# Patient Record
Sex: Female | Born: 1985 | Hispanic: No | Marital: Married | Smoking: Current every day smoker
Health system: Southern US, Community
[De-identification: ages and names within clinical notes are randomized; demographics above are authoritative.]

## PROBLEM LIST (undated history)

## (undated) ENCOUNTER — Inpatient Hospital Stay (HOSPITAL_COMMUNITY): Payer: Self-pay

## (undated) DIAGNOSIS — F1111 Opioid abuse, in remission: Secondary | ICD-10-CM

## (undated) DIAGNOSIS — R87629 Unspecified abnormal cytological findings in specimens from vagina: Secondary | ICD-10-CM

## (undated) DIAGNOSIS — O139 Gestational [pregnancy-induced] hypertension without significant proteinuria, unspecified trimester: Secondary | ICD-10-CM

## (undated) HISTORY — DX: Opioid abuse, in remission: F11.11

## (undated) HISTORY — DX: Gestational (pregnancy-induced) hypertension without significant proteinuria, unspecified trimester: O13.9

## (undated) HISTORY — PX: CHOLECYSTECTOMY: SHX55

## (undated) HISTORY — DX: Unspecified abnormal cytological findings in specimens from vagina: R87.629

---

## 2003-01-20 DIAGNOSIS — R87629 Unspecified abnormal cytological findings in specimens from vagina: Secondary | ICD-10-CM

## 2003-01-20 HISTORY — DX: Unspecified abnormal cytological findings in specimens from vagina: R87.629

## 2004-01-20 HISTORY — PX: COLPOSCOPY: SHX161

## 2005-01-19 DIAGNOSIS — O139 Gestational [pregnancy-induced] hypertension without significant proteinuria, unspecified trimester: Secondary | ICD-10-CM

## 2005-01-19 HISTORY — DX: Gestational (pregnancy-induced) hypertension without significant proteinuria, unspecified trimester: O13.9

## 2005-05-10 DIAGNOSIS — O139 Gestational [pregnancy-induced] hypertension without significant proteinuria, unspecified trimester: Secondary | ICD-10-CM

## 2012-10-29 ENCOUNTER — Encounter (HOSPITAL_COMMUNITY): Payer: Self-pay | Admitting: *Deleted

## 2012-10-29 ENCOUNTER — Inpatient Hospital Stay (HOSPITAL_COMMUNITY)
Admission: AD | Admit: 2012-10-29 | Discharge: 2012-10-29 | Disposition: A | Discharge status: Home / Self Care | Payer: Self-pay | Source: Ambulatory Visit | Attending: Obstetrics & Gynecology | Admitting: Obstetrics & Gynecology

## 2012-10-29 DIAGNOSIS — O26859 Spotting complicating pregnancy, unspecified trimester: Secondary | ICD-10-CM | POA: Insufficient documentation

## 2012-10-29 DIAGNOSIS — O0930 Supervision of pregnancy with insufficient antenatal care, unspecified trimester: Secondary | ICD-10-CM | POA: Insufficient documentation

## 2012-10-29 DIAGNOSIS — R109 Unspecified abdominal pain: Secondary | ICD-10-CM | POA: Insufficient documentation

## 2012-10-29 DIAGNOSIS — O26899 Other specified pregnancy related conditions, unspecified trimester: Secondary | ICD-10-CM

## 2012-10-29 DIAGNOSIS — O9989 Other specified diseases and conditions complicating pregnancy, childbirth and the puerperium: Secondary | ICD-10-CM

## 2012-10-29 LAB — URINALYSIS, ROUTINE W REFLEX MICROSCOPIC
Glucose, UA: NEGATIVE mg/dL
Leukocytes, UA: NEGATIVE
Protein, ur: NEGATIVE mg/dL
Specific Gravity, Urine: 1.02 (ref 1.005–1.030)
Urobilinogen, UA: 0.2 mg/dL (ref 0.0–1.0)

## 2012-10-29 LAB — WET PREP, GENITAL
Trich, Wet Prep: NONE SEEN
Yeast Wet Prep HPF POC: NONE SEEN

## 2012-10-29 LAB — URINE MICROSCOPIC-ADD ON

## 2012-10-29 MED ORDER — PRENATAL PLUS 27-1 MG PO TABS: 1.0000 | ORAL_TABLET | Freq: Every day | ORAL | Status: DC

## 2012-10-29 NOTE — MAU Note (Signed)
Pt presents with complaints of abdominal cramping and vaginal spotting for approximately 3 days.

## 2012-10-29 NOTE — MAU Provider Note (Signed)
Attestation of Attending Supervision of Advanced Practitioner (PA/CNM/NP): Evaluation and management procedures were performed by the Advanced Practitioner under my supervision and collaboration.  I have reviewed the Advanced Practitioner's note and chart, and I agree with the management and plan.  UGONNA  ANYANWU, MD, FACOG Attending Obstetrician & Gynecologist Faculty Practice, [facility]   

## 2012-10-29 NOTE — MAU Provider Note (Signed)
History     CSN: [id number]  Arrival date and time: 10/29/12 1021   None     Chief Complaint  Patient presents with  . Abdominal Cramping  . Vaginal Bleeding   HPI Mikayla Williamson is a 27 y.o. [phone] female @ [redacted]w[redacted]d by uncertain LMP who presents w/ report of cramping and spotting x 3 days. Increased urinary frequency x last few weeks, dysuria 'sometimes'.  Denies abnormal or malodorous d/c, has had some vulvar irritation recently, denies itching. Last sexual intercourse ~42month ago.  Reports fm x ~3.5wks. Denies lof. GHTN//Pre-e w/ 1st pregnancy, no complications w/ 2nd pregnancy. Both term SVDs. Lives in [place], no pnc to date, medicaid is pending. Went to [facility] x 1 for verification of pregnancy, then [facility] hospital x 1. Didn't want to go [facility] today.  Thinks she wants to start pnc around here. States no one has done an u/s, 'they just don't care how far along I am'. Not taking pnv. RN states 1st thing she was asked when entering room, was if they could get an u/s.   OB History   Grav Para Term Preterm Abortions TAB SAB Ect Mult Living   4 2 2  0 1 0 1 0 0 2      No past medical history on file.  No past surgical history on file.  No family history on file.  History  Substance Use Topics  . Smoking status: Not on file  . Smokeless tobacco: Not on file  . Alcohol Use: Not on file    Allergies: Allergies not on file  No prescriptions prior to admission    Review of Systems  Constitutional: Negative.   HENT: Negative.   Eyes: Negative.   Respiratory: Negative.   Cardiovascular: Negative.   Gastrointestinal: Positive for abdominal pain (cramping).  Genitourinary: Positive for dysuria ('sometimes') and frequency (x last few weeks).  Musculoskeletal: Negative.   Skin: Negative.   Neurological: Negative.   Endo/Heme/Allergies: Negative.   Psychiatric/Behavioral: Negative.    Physical Exam   Blood pressure 130/72, pulse 104, temperature 97.4 F  (36.3 C), temperature source Oral, resp. rate 18, height 5\' 2"  (1.575 m), weight 77.565 kg (171 lb), last menstrual period 04/28/2012.  Physical Exam  Constitutional: She is oriented to person, place, and time. She appears well-developed and well-nourished.  HENT:  Head: Normocephalic.  Neck: Normal range of motion.  Cardiovascular: Normal rate.   Respiratory: Effort normal.  GI: Soft. There is no tenderness.  FH 24cm  Genitourinary:  Spec exam: cx visually closed, mod amt creamy white nonodorous d/c. No active or residual bleeding.  No erythema or visible areas of irritation to external genitalia SVE: LTC high  Musculoskeletal: Normal range of motion.  Neurological: She is alert and oriented to person, place, and time.  Skin: Skin is warm and dry.  Psychiatric: She has a normal mood and affect. Her behavior is normal. Judgment and thought content normal.   FHR: 145 via doppler  MAU Course  Procedures  FHR via doppler by RN upon arrival UA Spec exam w/ gc/ch and wet prep SVE  Offered to send message to our clinics to begin pnc, pt would like for me to do that  Results for orders placed during the hospital encounter of 10/29/12 (from the past 24 hour(s))  URINALYSIS, ROUTINE W REFLEX MICROSCOPIC     Status: Abnormal   Collection Time    10/29/12 10:33 AM      Result Value Range  Color, Urine YELLOW  YELLOW   APPearance CLEAR  CLEAR   Specific Gravity, Urine 1.020  1.005 - 1.030   pH 7.0  5.0 - 8.0   Glucose, UA NEGATIVE  NEGATIVE mg/dL   Hgb urine dipstick TRACE (*) NEGATIVE   Bilirubin Urine NEGATIVE  NEGATIVE   Ketones, ur NEGATIVE  NEGATIVE mg/dL   Protein, ur NEGATIVE  NEGATIVE mg/dL   Urobilinogen, UA 0.2  0.0 - 1.0 mg/dL   Nitrite NEGATIVE  NEGATIVE   Leukocytes, UA NEGATIVE  NEGATIVE  URINE MICROSCOPIC-ADD ON     Status: Abnormal   Collection Time    10/29/12 10:33 AM      Result Value Range   Squamous Epithelial / LPF FEW (*) RARE   WBC, UA 0-2  <3  WBC/hpf   RBC / HPF 3-6  <3 RBC/hpf   Bacteria, UA MANY (*) RARE  WET PREP, GENITAL     Status: Abnormal   Collection Time    10/29/12 11:30 AM      Result Value Range   Yeast Wet Prep HPF POC NONE SEEN  NONE SEEN   Trich, Wet Prep NONE SEEN  NONE SEEN   Clue Cells Wet Prep HPF POC NONE SEEN  NONE SEEN   WBC, Wet Prep HPF POC MANY (*) NONE SEEN     Assessment and Plan  A:   [redacted]w[redacted]d GA  [id number]   Cramping, reported spotting, w/ normal exam today  No PNC to date   P:   D/C home  Rx prenatal plus #30 w/ 12RF  Push po fluids, rest, pelvic rest   OP dating/anatomy u/s ordered   Message sent to [facility] that pt needed new ob appt  Pt to call clinic if she doesn't here from them on Mon  Reviewed ptl s/s, fm, reasons to return    Marge Duncans 10/29/2012, 11:14 AM

## 2012-10-30 LAB — CULTURE, OB URINE
Colony Count: 10000
Special Requests: NORMAL

## 2012-11-04 ENCOUNTER — Ambulatory Visit (HOSPITAL_COMMUNITY): Admission: RE | Admit: 2012-11-04 | Payer: Self-pay | Source: Ambulatory Visit

## 2012-11-04 ENCOUNTER — Ambulatory Visit (HOSPITAL_COMMUNITY)
Admission: RE | Admit: 2012-11-04 | Discharge: 2012-11-04 | Disposition: A | Discharge status: Home / Self Care | Payer: Self-pay | Source: Ambulatory Visit | Attending: Women's Health | Admitting: Women's Health

## 2012-11-04 DIAGNOSIS — O0930 Supervision of pregnancy with insufficient antenatal care, unspecified trimester: Secondary | ICD-10-CM | POA: Insufficient documentation

## 2012-11-04 DIAGNOSIS — O26899 Other specified pregnancy related conditions, unspecified trimester: Secondary | ICD-10-CM

## 2012-11-04 DIAGNOSIS — Z3689 Encounter for other specified antenatal screening: Secondary | ICD-10-CM | POA: Insufficient documentation

## 2012-11-04 IMAGING — US US OB COMP +14 WK
1 series · 12 of 28 positions shown · non-contrast
Comparison: none

[Series 1: us ob comp +14 wk · 84 acquisitions, 12 frames shown]
[im 4/84]
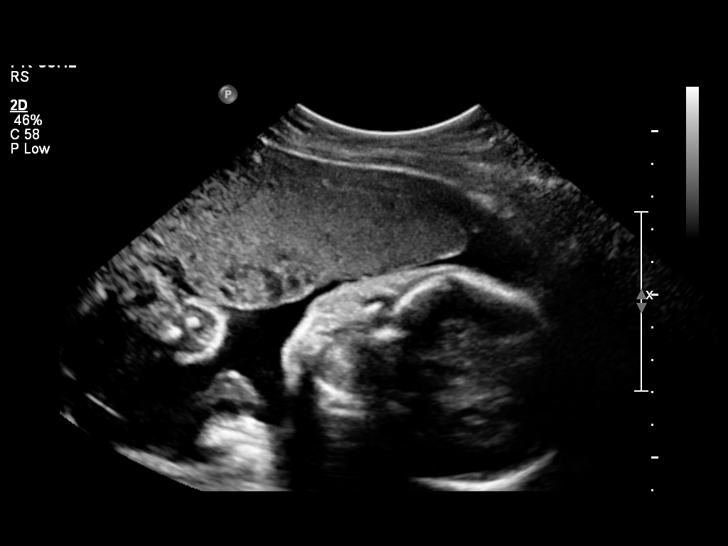
[im 10/84]
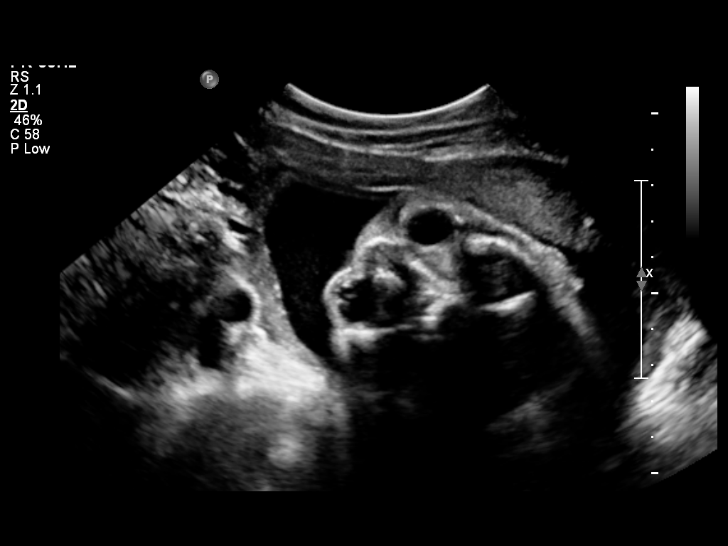
[im 16/84]
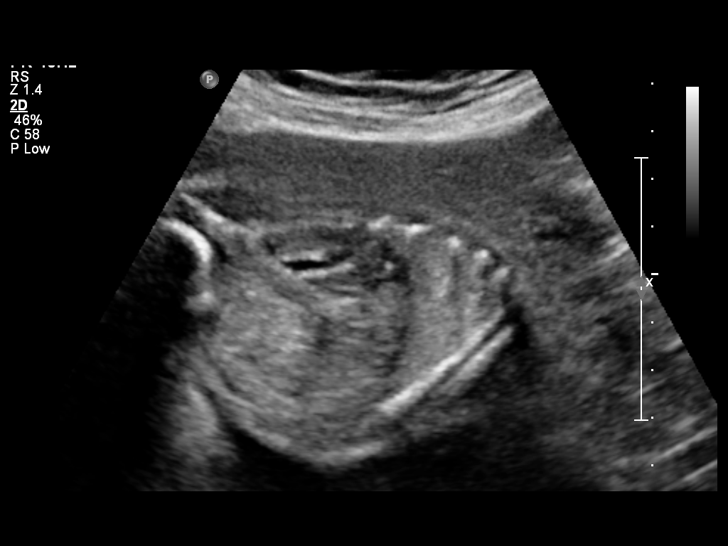
[im 25/84]
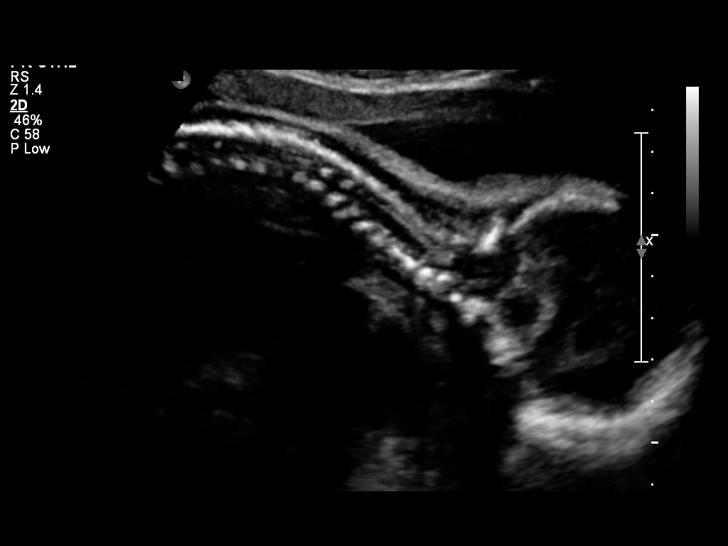
[im 31/84]
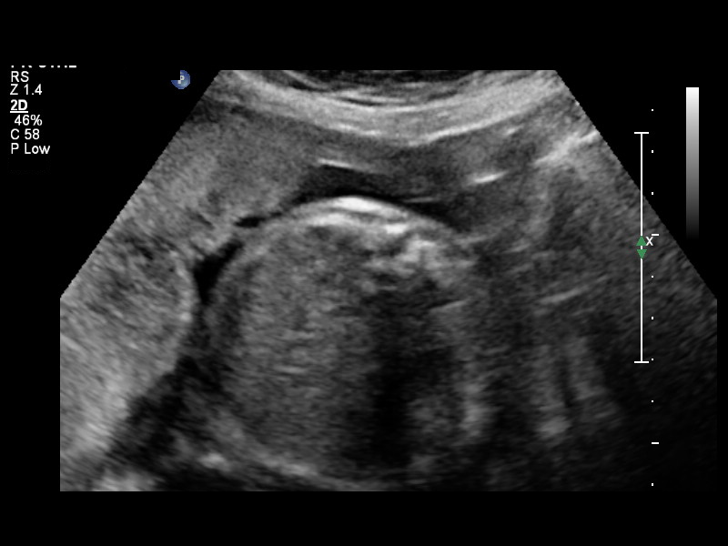
[im 37/84]
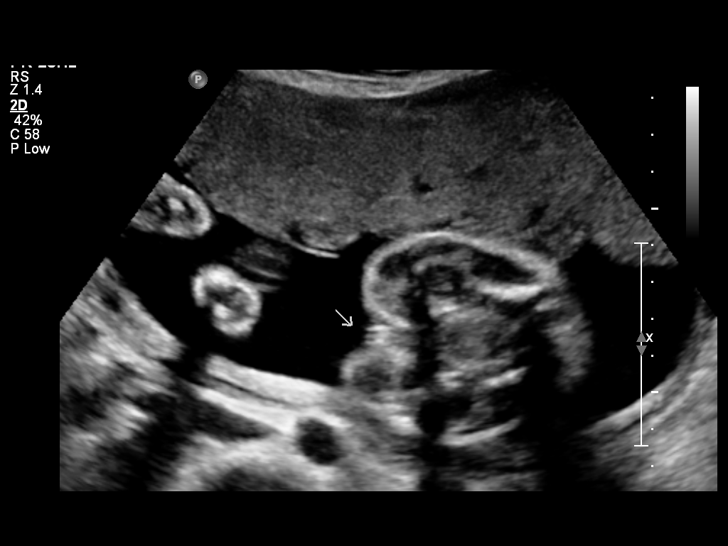
[im 47/84]
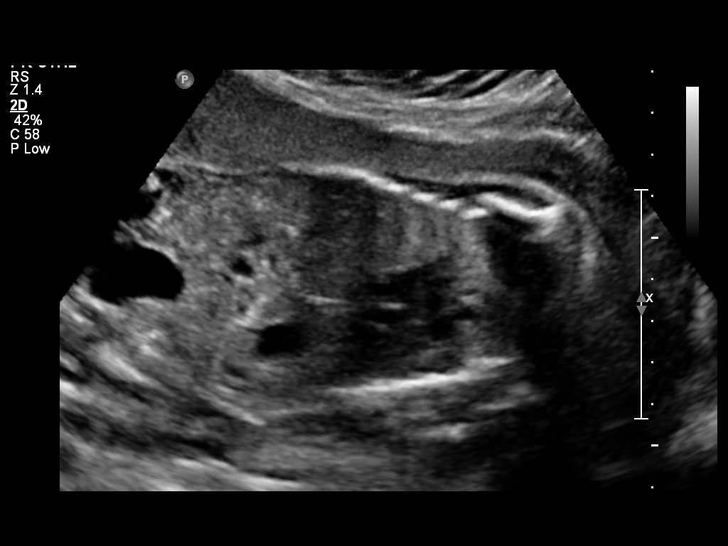
[im 53/84]
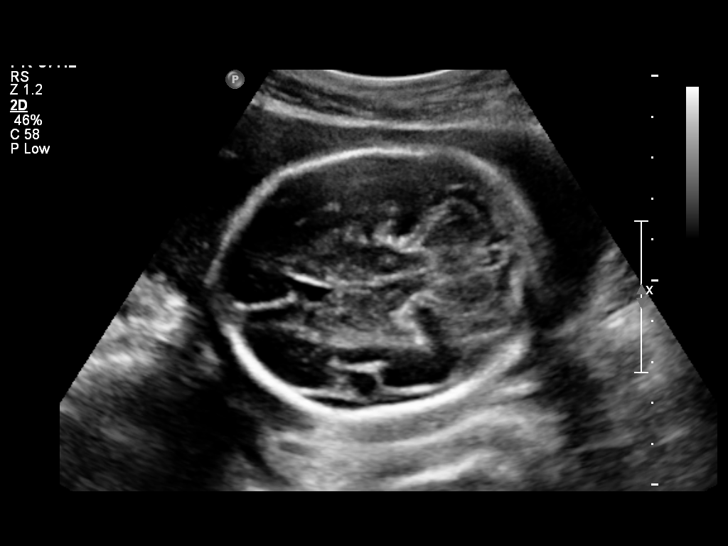
[im 59/84]
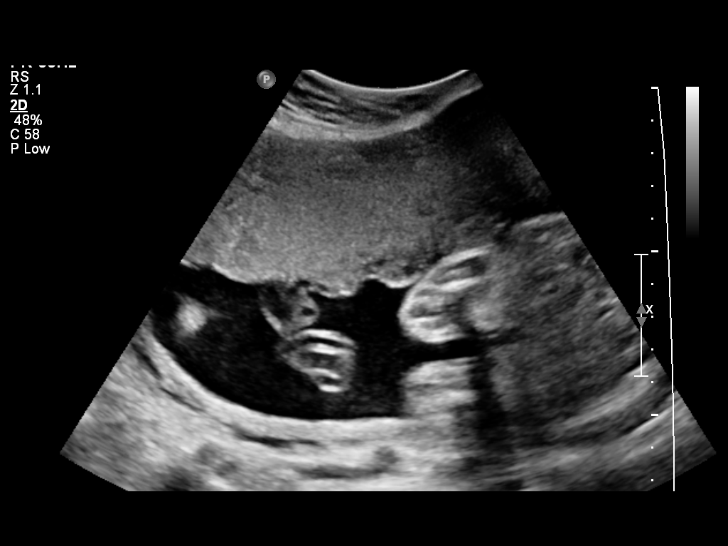
[im 68/84]
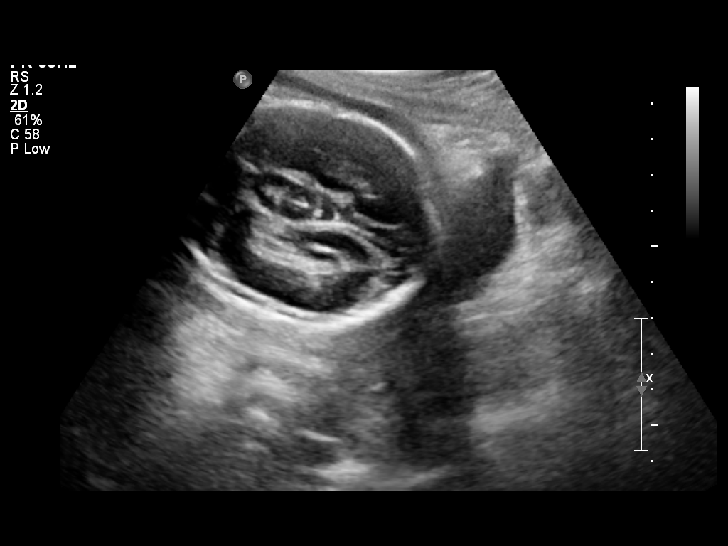
[im 74/84]
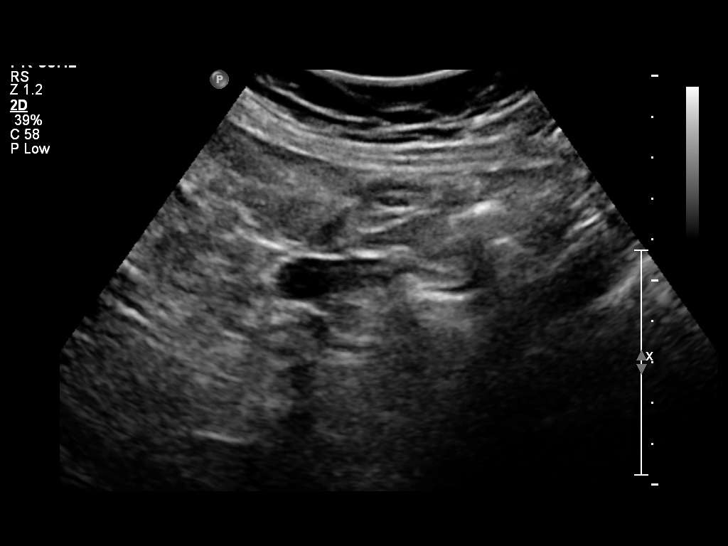
[im 80/84]
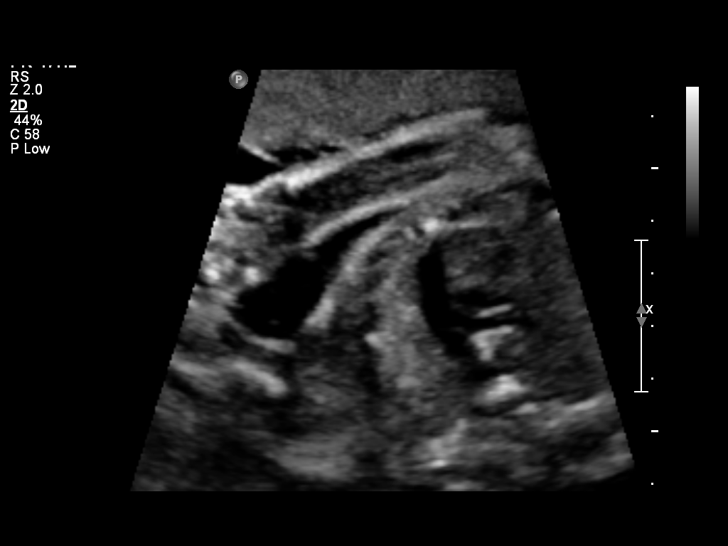

[12 of 28 positions shown; findings below may reference images not displayed]

OBSTETRICS REPORT
                      (Signed Final [DATE] [DATE])

Service(s) Provided

 US OB COMP + 14 WK                                    76805.1
Indications

 Basic anatomic survey                                 [QR]
 No or Little Prenatal Care                            [QR]
Fetal Evaluation

 Num Of Fetuses:    1
 Fetal Heart Rate:  139                          bpm
 Cardiac Activity:  Observed
 Presentation:      Cephalic
 Placenta:          Anterior, above cervical os
 P. Cord            Not well visualized
 Insertion:

 Amniotic Fluid
 AFI FV:      Subjectively within normal limits
                                             Larg Pckt:     4.6  cm
Biometry

 BPD:       63  mm     G. Age:  25w 4d                CI:        71.21   70 - 86
                                                      FL/HC:      20.4   18.6 -

 HC:     237.8  mm     G. Age:  25w 6d       18  %    HC/AC:      1.06   1.04 -

 AC:     223.7  mm     G. Age:  26w 5d       61  %    FL/BPD:     77.1   71 - 87
 FL:      48.6  mm     G. Age:  26w 2d       42  %    FL/AC:      21.7   20 - 24

 Est. FW:     938  gm      2 lb 1 oz     60  %
Gestational Age

 U/S Today:     26w 1d                                        EDD:   [DATE]
 Best:          26w 1d     Det. By:  U/S ([DATE])           EDD:   [DATE]
Anatomy

 Cranium:          Appears normal         Aortic Arch:      Appears normal
 Fetal Cavum:      Appears normal         Ductal Arch:      Basic anatomy
                                                            exam per order
 Ventricles:       Appears normal         Diaphragm:        Appears normal
 Choroid Plexus:   Appears normal         Stomach:          Appears normal, left
                                                            sided
 Cerebellum:       Appears normal         Abdomen:          Appears normal
 Posterior Fossa:  Appears normal         Abdominal Wall:   Appears nml (cord
                                                            insert, abd wall)
 Nuchal Fold:      Not applicable (>20    Cord Vessels:     Appears normal (3
                   wks GA)                                  vessel cord)
 Face:             Appears normal         Kidneys:          Appear normal
                   (orbits and profile)
 Lips:             Not well visualized    Bladder:          Appears normal
 Palate:           Not well visualized    Spine:            Appears normal
 Heart:            Appears normal         Lower             Appears normal
                   (4CH, axis, and        Extremities:
                   situs)
 RVOT:             Appears normal         Upper             Appears normal
                                          Extremities:
 LVOT:             Appears normal

 Other:  Female gender. Nasal bone visualized. Heels visualized.
Cervix Uterus Adnexa

 Cervical Length:    3.9      cm

 Cervix:       Normal appearance by transabdominal scan.
 Left Ovary:    Within normal limits.
 Right Ovary:   Not visualized.

 Adnexa:     No abnormality visualized.
Impression

 Active SIUP at [QR] on comprehensive fetal survey
 EFW is at the 60th percentile for GA
 AFV is gestational age appropriate
 No dysmorphic features with limitations as documented
 above
Recommendations

 Follow up interval growth in 4-6 weeks with attempt to
 complete fetal survey.

 questions or concerns.

## 2012-11-21 ENCOUNTER — Encounter: Payer: Self-pay | Admitting: Family Medicine

## 2013-09-03 ENCOUNTER — Encounter (HOSPITAL_COMMUNITY): Payer: Self-pay | Admitting: *Deleted

## 2013-11-20 ENCOUNTER — Encounter (HOSPITAL_COMMUNITY): Payer: Self-pay | Admitting: *Deleted

## 2015-12-18 ENCOUNTER — Encounter (HOSPITAL_COMMUNITY): Payer: Self-pay

## 2015-12-18 ENCOUNTER — Encounter (HOSPITAL_COMMUNITY): Payer: Self-pay | Admitting: *Deleted

## 2015-12-18 ENCOUNTER — Inpatient Hospital Stay (HOSPITAL_COMMUNITY)
Admission: AD | Admit: 2015-12-18 | Discharge: 2015-12-18 | Disposition: A | Discharge status: Home / Self Care | Payer: Medicaid Other | Source: Ambulatory Visit | Attending: Obstetrics & Gynecology | Admitting: Obstetrics & Gynecology

## 2015-12-18 DIAGNOSIS — Z3A27 27 weeks gestation of pregnancy: Secondary | ICD-10-CM | POA: Diagnosis not present

## 2015-12-18 DIAGNOSIS — R220 Localized swelling, mass and lump, head: Secondary | ICD-10-CM | POA: Diagnosis present

## 2015-12-18 DIAGNOSIS — K0889 Other specified disorders of teeth and supporting structures: Secondary | ICD-10-CM | POA: Diagnosis present

## 2015-12-18 DIAGNOSIS — O99332 Smoking (tobacco) complicating pregnancy, second trimester: Secondary | ICD-10-CM | POA: Insufficient documentation

## 2015-12-18 DIAGNOSIS — K047 Periapical abscess without sinus: Secondary | ICD-10-CM | POA: Insufficient documentation

## 2015-12-18 DIAGNOSIS — O98812 Other maternal infectious and parasitic diseases complicating pregnancy, second trimester: Secondary | ICD-10-CM | POA: Insufficient documentation

## 2015-12-18 DIAGNOSIS — O0932 Supervision of pregnancy with insufficient antenatal care, second trimester: Secondary | ICD-10-CM

## 2015-12-18 DIAGNOSIS — R030 Elevated blood-pressure reading, without diagnosis of hypertension: Secondary | ICD-10-CM

## 2015-12-18 LAB — COMPREHENSIVE METABOLIC PANEL
ALBUMIN: 3 g/dL — AB (ref 3.5–5.0)
ALK PHOS: 122 U/L (ref 38–126)
ALT: 27 U/L (ref 14–54)
AST: 25 U/L (ref 15–41)
Anion gap: 9 (ref 5–15)
BILIRUBIN TOTAL: 0.7 mg/dL (ref 0.3–1.2)
BUN: 8 mg/dL (ref 6–20)
CALCIUM: 8.8 mg/dL — AB (ref 8.9–10.3)
CO2: 25 mmol/L (ref 22–32)
CREATININE: 0.46 mg/dL (ref 0.44–1.00)
Chloride: 102 mmol/L (ref 101–111)
GFR calc Af Amer: >60 mL/min (ref >60)
GLUCOSE: 84 mg/dL (ref 65–99)
Potassium: 3.7 mmol/L (ref 3.5–5.1)
Sodium: 136 mmol/L (ref 135–145)
TOTAL PROTEIN: 7.8 g/dL (ref 6.5–8.1)

## 2015-12-18 LAB — CBC
HEMATOCRIT: 38.8 % (ref 36.0–46.0)
HEMOGLOBIN: 13.7 g/dL (ref 12.0–15.0)
MCH: 31.2 pg (ref 26.0–34.0)
MCHC: 35.3 g/dL (ref 30.0–36.0)
MCV: 88.4 fL (ref 78.0–100.0)
Platelets: 267 10*3/uL (ref 150–400)
RBC: 4.39 MIL/uL (ref 3.87–5.11)
RDW: 13.7 % (ref 11.5–15.5)
WBC: 14.2 10*3/uL — ABNORMAL HIGH (ref 4.0–10.5)

## 2015-12-18 LAB — DIFFERENTIAL
BASOS ABS: 0 10*3/uL (ref 0.0–0.1)
BASOS PCT: 0 %
EOS ABS: 0.1 10*3/uL (ref 0.0–0.7)
EOS PCT: 1 %
LYMPHS ABS: 1.8 10*3/uL (ref 0.7–4.0)
Lymphocytes Relative: 13 %
MONOS PCT: 11 %
Monocytes Absolute: 1.5 10*3/uL — ABNORMAL HIGH (ref 0.1–1.0)
NEUTROS PCT: 75 %
Neutro Abs: 10.7 10*3/uL — ABNORMAL HIGH (ref 1.7–7.7)

## 2015-12-18 LAB — RAPID URINE DRUG SCREEN, HOSP PERFORMED
Amphetamines: NOT DETECTED
Barbiturates: NOT DETECTED
Benzodiazepines: NOT DETECTED
COCAINE: NOT DETECTED
OPIATES: POSITIVE — AB
TETRAHYDROCANNABINOL: NOT DETECTED

## 2015-12-18 LAB — URINALYSIS, ROUTINE W REFLEX MICROSCOPIC
Glucose, UA: NEGATIVE mg/dL
KETONES UR: 15 mg/dL — AB
Leukocytes, UA: NEGATIVE
NITRITE: POSITIVE — AB
PROTEIN: 100 mg/dL — AB
pH: 6 (ref 5.0–8.0)

## 2015-12-18 LAB — TYPE AND SCREEN
ABO/RH(D): O POS
Antibody Screen: NEGATIVE

## 2015-12-18 LAB — URINE MICROSCOPIC-ADD ON

## 2015-12-18 LAB — PROTEIN / CREATININE RATIO, URINE
CREATININE, URINE: 349 mg/dL
Protein Creatinine Ratio: 0.21 mg/mg{Cre} — ABNORMAL HIGH (ref 0.00–0.15)
Total Protein, Urine: 73 mg/dL

## 2015-12-18 LAB — HEPATITIS B SURFACE ANTIGEN: Hepatitis B Surface Ag: NEGATIVE

## 2015-12-18 MED ORDER — CEPHALEXIN 500 MG PO CAPS: 500.0000 mg | ORAL_CAPSULE | Freq: Four times a day (QID) | ORAL | 0 refills | Status: DC

## 2015-12-18 MED ORDER — OXYCODONE-ACETAMINOPHEN 5-325 MG PO TABS
2.0000 | ORAL_TABLET | Freq: Once | ORAL | Status: AC
  Administered 2015-12-18: 2 via ORAL
  Filled 2015-12-18: qty 2

## 2015-12-18 MED ORDER — IBUPROFEN 600 MG PO TABS: 600.0000 mg | ORAL_TABLET | Freq: Four times a day (QID) | ORAL | 0 refills | Status: DC | PRN

## 2015-12-18 MED ORDER — OXYCODONE-ACETAMINOPHEN 5-325 MG PO TABS: 1.0000 | ORAL_TABLET | ORAL | 0 refills | Status: DC | PRN

## 2015-12-18 NOTE — MAU Note (Signed)
The left side of her face was swollen when she woke up.  Has been having a tooth ache,(upper left). Lower left side of face is numb

## 2015-12-18 NOTE — MAU Provider Note (Signed)
History     CSN: [id number]  Arrival date and time: 12/18/15 1614   None     Chief Complaint  Patient presents with  . Facial Swelling  . Dental Pain   HPI   Ms. Mikayla Williamson is a 29 y.o. female G5P3013 @ [redacted]w[redacted]d here in MAU with left sided dental pain and facial swelling. The pain started 3 days and the swelling started this morning. She took an ibuprofen today and 1 percocet that a friend gave her. This only helped her pain minimally.  She denies SOB, or trouble breathing.  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   + fetal movement History of Gestational HTN with first pregnancy only- negative for history of preeclampsia.   OB History    Gravida Para Term Preterm AB Living   5 3 3 0 1 3   SAB TAB Ectopic Multiple Live Births   1 0 0   2      History reviewed. No pertinent past medical history.  Past Surgical History:  Procedure Laterality Date  . CHOLECYSTECTOMY      History reviewed. No pertinent family history.  Social History  Substance Use Topics  . Smoking status: Current Every Day Smoker  . Smokeless tobacco: Current User  . Alcohol use No    Allergies: No Known Allergies  Prescriptions Prior to Admission  Medication Sig Dispense Refill Last Dose  . buprenorphine (SUBUTEX) 8 MG SUBL SL tablet Place 8 mg under the tongue 2 (two) times daily.   12/17/2015 at Unknown time  . ibuprofen (ADVIL,MOTRIN) 200 MG tablet Take 600 mg by mouth every 6 (six) hours as needed for mild pain.   12/18/2015 at Unknown time  . prenatal vitamin w/FE, FA (PRENATAL 1 + 1) 27-1 MG TABS tablet Take 1 tablet by mouth daily at 12 noon. (Patient not taking: Reported on 12/18/2015) 30 each 12 Not Taking at Unknown time   Results for orders placed or performed during the hospital encounter of 12/18/15 (from the past 48 hour(s))  Urinalysis, Routine w reflex microscopic (not at [facility])     Status: Abnormal   Collection Time: 12/18/15  4:48 PM  Result  Value Ref Range   Color, Urine YELLOW YELLOW   APPearance TURBID (A) CLEAR   Specific Gravity, Urine >1.030 (H) 1.005 - 1.030   pH 6.0 5.0 - 8.0   Glucose, UA NEGATIVE NEGATIVE mg/dL   Hgb urine dipstick TRACE (A) NEGATIVE   Bilirubin Urine SMALL (A) NEGATIVE   Ketones, ur 15 (A) NEGATIVE mg/dL   Protein, ur 100 (A) NEGATIVE mg/dL   Nitrite POSITIVE (A) NEGATIVE   Leukocytes, UA NEGATIVE NEGATIVE  Urine rapid drug screen (hosp performed)     Status: Abnormal   Collection Time: 12/18/15  4:48 PM  Result Value Ref Range   Opiates POSITIVE (A) NONE DETECTED   Cocaine NONE DETECTED NONE DETECTED   Benzodiazepines NONE DETECTED NONE DETECTED   Amphetamines NONE DETECTED NONE DETECTED   Tetrahydrocannabinol NONE DETECTED NONE DETECTED   Barbiturates NONE DETECTED NONE DETECTED    Comment:        DRUG SCREEN FOR MEDICAL PURPOSES ONLY.  IF CONFIRMATION IS NEEDED FOR ANY PURPOSE, NOTIFY LAB WITHIN 5 DAYS.        LOWEST DETECTABLE LIMITS FOR URINE DRUG SCREEN Drug Class       Cutoff (ng/mL) Amphetamine      1000 Barbiturate        200 Benzodiazepine   200 Tricyclics       300 Opiates          300 Cocaine          300 THC              50   Protein / creatinine ratio, urine     Status: Abnormal   Collection Time: 12/18/15  4:48 PM  Result Value Ref Range   Creatinine, Urine 349.00 mg/dL   Total Protein, Urine 73 mg/dL    Comment: NO NORMAL RANGE ESTABLISHED FOR THIS TEST   Protein Creatinine Ratio 0.21 (H) 0.00 - 0.15 mg/mg[Cre]  Urine microscopic-add on     Status: Abnormal   Collection Time: 12/18/15  4:48 PM  Result Value Ref Range   Squamous Epithelial / LPF 0-5 (A) NONE SEEN   WBC, UA 0-5 0 - 5 WBC/hpf   RBC / HPF 0-5 0 - 5 RBC/hpf   Bacteria, UA RARE (A) NONE SEEN   Urine-Other AMORPHOUS URATES/PHOSPHATES   Comprehensive metabolic panel     Status: Abnormal   Collection Time: 12/18/15  7:13 PM  Result Value Ref Range   Sodium 136 135 - 145 mmol/L   Potassium 3.7 3.5 -  5.1 mmol/L   Chloride 102 101 - 111 mmol/L   CO2 25 22 - 32 mmol/L   Glucose, Bld 84 65 - 99 mg/dL   BUN 8 6 - 20 mg/dL   Creatinine, Ser 0.46 0.44 - 1.00 mg/dL   Calcium 8.8 (L) 8.9 - 10.3 mg/dL   Total Protein 7.8 6.5 - 8.1 g/dL   Albumin 3.0 (L) 3.5 - 5.0 g/dL   AST 25 15 - 41 U/L   ALT 27 14 - 54 U/L   Alkaline Phosphatase 122 38 - 126 U/L   Total Bilirubin 0.7 0.3 - 1.2 mg/dL   GFR calc non Af Amer >60 >60 mL/min   GFR calc Af Amer >60 >60 mL/min    Comment: (NOTE) The eGFR has been calculated using the CKD EPI equation. This calculation has not been validated in all clinical situations. eGFR's persistently <60 mL/min signify possible Chronic Kidney Disease.    Anion gap 9 5 - 15  CBC     Status: Abnormal   Collection Time: 12/18/15  7:13 PM  Result Value Ref Range   WBC 14.2 (H) 4.0 - 10.5 K/uL   RBC 4.39 3.87 - 5.11 MIL/uL   Hemoglobin 13.7 12.0 - 15.0 g/dL   HCT 38.8 36.0 - 46.0 %   MCV 88.4 78.0 - 100.0 fL   MCH 31.2 26.0 - 34.0 pg   MCHC 35.3 30.0 - 36.0 g/dL   RDW 13.7 11.5 - 15.5 %   Platelets 267 150 - 400 K/uL  Type and screen [facility]     Status: None   Collection Time: 12/18/15  7:13 PM  Result Value Ref Range   ABO/RH(D) O POS    Antibody Screen NEG    Sample Expiration 12/21/2015   Differential     Status: Abnormal   Collection Time: 12/18/15  7:13 PM  Result Value Ref Range   Neutrophils Relative % 75 %   Neutro Abs 10.7 (H) 1.7 - 7.7 K/uL   Lymphocytes Relative 13 %   Lymphs Abs 1.8 0.7 - 4.0 K/uL   Monocytes Relative 11 %   Monocytes Absolute 1.5 (H) 0.1 - 1.0 K/uL   Eosinophils Relative 1 %   Eosinophils Absolute   0.1 0.0 - 0.7 K/uL   Basophils Relative 0 %   Basophils Absolute 0.0 0.0 - 0.1 K/uL    Review of Systems  Constitutional: Negative for chills and fever.   Physical Exam   Blood pressure 149/93, pulse 101, temperature 98 F (36.7 C), temperature source Oral, resp. rate 18, weight 176 lb (79.8 kg), unknown  if currently breastfeeding.   Patient Vitals for the past 24 hrs:  BP Pulse Resp  12/18/15 2124 117/78 95 18  12/18/15 1946 128/78 97 -  12/18/15 1931 108/80 97 -  12/18/15 1920 145/80 99 -  12/18/15 1901 149/93 101 -  12/18/15 1846 136/85 100 -  12/18/15 1831 136/83 102 -  12/18/15 1822 144/88 109 -    Physical Exam  Constitutional: She is oriented to person, place, and time. She appears well-developed and well-nourished. No distress.  HENT:  Mouth/Throat: Dental caries present. No uvula swelling. No oropharyngeal exudate, posterior oropharyngeal edema, posterior oropharyngeal erythema or tonsillar abscesses.    GI: Soft. She exhibits no distension. There is no tenderness.  Musculoskeletal: Normal range of motion.  Neurological: She is alert and oriented to person, place, and time.  Skin: She is not diaphoretic.   Fetal Tracing: Baseline: 130 bpm  Variability: Moderate  Accelerations: 15x15 Decelerations: None Toco: None   MAU Course  Procedures  None  MDM  -Prenatal panel -Percocet 2 tabs  -PIH labs due to patients history of gestational HTN, no prenatal care, and several elevated BP readings in MAU.  -Discussed patient with Dr. Vanita Panda at [facility] ED> Discussed management at home.   Assessment and Plan   A:  1. Dental abscess   2. Elevated BP without diagnosis of hypertension   3. No prenatal care in current pregnancy in second trimester     P:  Discharge home in stable condition Rx: short course of percocet, Keflex, Ibuprofen  Preeclampsia precautions If dental pain worsens, go to [place] clinic info given Strict return precautions  Lezlie Lye, NP 12/19/2015 5:28 PM

## 2015-12-18 NOTE — Discharge Instructions (Signed)
Dental Abscess °Introduction °A dental abscess is pus in or around a tooth. °Follow these instructions at home: °· Take medicines only as told by your dentist. °· If you were prescribed antibiotic medicine, finish all of it even if you start to feel better. °· Rinse your mouth (gargle) often with salt water. °· Do not drive or use heavy machinery, like a lawn mower, while taking pain medicine. °· Do not apply heat to the outside of your mouth. °· Keep all follow-up visits as told by your dentist. This is important. °Contact a doctor if: °· Your pain is worse, and medicine does not help. °Get help right away if: °· You have a fever or chills. °· Your symptoms suddenly get worse. °· You have a very bad headache. °· You have problems breathing or swallowing. °· You have trouble opening your mouth. °· You have puffiness (swelling) in your neck or around your eye. °This information is not intended to replace advice given to you by your health care provider. Make sure you discuss any questions you have with your health care provider. °Document Released: 05/22/2014 Document Revised: 06/13/2015 Document Reviewed: 01/02/2014 °© 2017 Elsevier ° °

## 2015-12-18 NOTE — MAU Note (Signed)
Urine in lab 

## 2015-12-19 LAB — RUBELLA SCREEN: Rubella: 4.11 index (ref >0.99)

## 2015-12-19 LAB — HIV ANTIBODY (ROUTINE TESTING W REFLEX): HIV Screen 4th Generation wRfx: NONREACTIVE

## 2015-12-19 LAB — ABO/RH: ABO/RH(D): O POS

## 2015-12-19 LAB — RPR: RPR Ser Ql: NONREACTIVE

## 2015-12-20 LAB — CULTURE, OB URINE: Culture: <10000 — AB

## 2015-12-21 ENCOUNTER — Encounter: Payer: Self-pay | Admitting: Obstetrics and Gynecology

## 2015-12-21 DIAGNOSIS — O0930 Supervision of pregnancy with insufficient antenatal care, unspecified trimester: Secondary | ICD-10-CM | POA: Insufficient documentation

## 2015-12-21 DIAGNOSIS — F1111 Opioid abuse, in remission: Secondary | ICD-10-CM | POA: Insufficient documentation

## 2015-12-21 DIAGNOSIS — O0990 Supervision of high risk pregnancy, unspecified, unspecified trimester: Secondary | ICD-10-CM | POA: Insufficient documentation

## 2015-12-25 ENCOUNTER — Encounter: Payer: Self-pay | Admitting: Student

## 2015-12-25 ENCOUNTER — Other Ambulatory Visit (HOSPITAL_COMMUNITY)
Admission: RE | Admit: 2015-12-25 | Discharge: 2015-12-25 | Disposition: A | Discharge status: Home / Self Care | Payer: Medicaid Other | Source: Ambulatory Visit | Attending: Student | Admitting: Student

## 2015-12-25 ENCOUNTER — Ambulatory Visit (INDEPENDENT_AMBULATORY_CARE_PROVIDER_SITE_OTHER): Payer: Medicaid Other | Admitting: Student

## 2015-12-25 VITALS — BP 123/81 | HR 115 | Wt 175.7 lb

## 2015-12-25 DIAGNOSIS — Z87898 Personal history of other specified conditions: Secondary | ICD-10-CM | POA: Diagnosis not present

## 2015-12-25 DIAGNOSIS — Z01419 Encounter for gynecological examination (general) (routine) without abnormal findings: Secondary | ICD-10-CM | POA: Insufficient documentation

## 2015-12-25 DIAGNOSIS — B373 Candidiasis of vulva and vagina: Secondary | ICD-10-CM

## 2015-12-25 DIAGNOSIS — O0933 Supervision of pregnancy with insufficient antenatal care, third trimester: Secondary | ICD-10-CM

## 2015-12-25 DIAGNOSIS — K047 Periapical abscess without sinus: Secondary | ICD-10-CM | POA: Diagnosis not present

## 2015-12-25 DIAGNOSIS — B3731 Acute candidiasis of vulva and vagina: Secondary | ICD-10-CM

## 2015-12-25 DIAGNOSIS — F1111 Opioid abuse, in remission: Secondary | ICD-10-CM

## 2015-12-25 DIAGNOSIS — Z113 Encounter for screening for infections with a predominantly sexual mode of transmission: Secondary | ICD-10-CM | POA: Insufficient documentation

## 2015-12-25 DIAGNOSIS — Z124 Encounter for screening for malignant neoplasm of cervix: Secondary | ICD-10-CM

## 2015-12-25 DIAGNOSIS — Z1151 Encounter for screening for human papillomavirus (HPV): Secondary | ICD-10-CM | POA: Insufficient documentation

## 2015-12-25 DIAGNOSIS — O98813 Other maternal infectious and parasitic diseases complicating pregnancy, third trimester: Secondary | ICD-10-CM

## 2015-12-25 DIAGNOSIS — O0990 Supervision of high risk pregnancy, unspecified, unspecified trimester: Secondary | ICD-10-CM

## 2015-12-25 LAB — POCT URINALYSIS DIP (DEVICE)
GLUCOSE, UA: NEGATIVE mg/dL
KETONES UR: NEGATIVE mg/dL
NITRITE: NEGATIVE
PROTEIN: 30 mg/dL — AB
Specific Gravity, Urine: 1.025 (ref 1.005–1.030)
Urobilinogen, UA: 1 mg/dL (ref 0.0–1.0)
pH: 6.5 (ref 5.0–8.0)

## 2015-12-25 MED ORDER — AMOXICILLIN-POT CLAVULANATE 875-125 MG PO TABS: 1.0000 | ORAL_TABLET | Freq: Two times a day (BID) | ORAL | 0 refills | Status: DC

## 2015-12-25 MED ORDER — TERCONAZOLE 0.8 % VA CREA: 1.0000 | TOPICAL_CREAM | Freq: Every day | VAGINAL | 0 refills | Status: DC

## 2015-12-25 MED ORDER — OXYCODONE-ACETAMINOPHEN 5-325 MG PO TABS: 1.0000 | ORAL_TABLET | Freq: Four times a day (QID) | ORAL | 0 refills | Status: DC | PRN

## 2015-12-25 NOTE — Progress Notes (Signed)
Subjective:    Mikayla Williamson is a [id number] 2248w1d being seen today for her first obstetrical visit.  Her obstetrical history is significant for pregnancy induced hypertension, smoker and late prenatal care. Patient does intend to breast feed. Pregnancy history fully reviewed.  Patient reports dental pain & vaginal discharge/irritation. Patient seen in MAU last week for dental pain. Diagnosed with dental abscess. Has been taking percocet & ibuprofen with minimal relief. Rx keflex for UTI. Has f/u appt with dental clinic in 2 weeks. Denies fever/chills. Also reports thick white vaginal discharge with associated irritation & itching since taking antibiotics.  Has hx of heroin abuse; goes to clinic in [place] for subutex.   Vitals:   12/25/15 0826 12/25/15 0913  BP: 123/81   Pulse: (!) 117 (!) 115  Weight: 175 lb 11.2 oz (79.7 kg)     HISTORY: OB History  Gravida Para Term Preterm AB Living  5 3 3  0 1 3  SAB TAB Ectopic Multiple Live Births  1 0 0   2    # Outcome Date GA Lbr Len/2nd Weight Sex Delivery Anes PTL Lv  5 Current           4 Term 02/02/13 1750w0d   F Vag-Spont        Birth Comments: System Generated. Please review and update pregnancy details.  3 Term 06/21/07    F Vag-Spont EPI N LIV  2 Term 05/10/05    F Vag-Spont EPI N LIV     Complications: Gestational hypertension  1 SAB              Past Medical History:  Diagnosis Date  . History of heroin abuse    subutex in [place]  . Pregnancy induced hypertension 2007  . Vaginal Pap smear, abnormal 2005   cervical biopsy negative   Past Surgical History:  Procedure Laterality Date  . CHOLECYSTECTOMY    . COLPOSCOPY  2006   Family History  Problem Relation Age of Onset  . Diabetes Father      Exam    Uterus:  Fundal Height: 26 cm  Pelvic Exam:    Perineum: No Hemorrhoids, Normal Perineum   Vulva: normal   Vagina:  curdlike discharge   Cervix: multiparous appearance, no lesions and spotting after pap collected    Skin: normal coloration and turgor, no rashes    Neurologic: oriented, normal mood   Extremities: normal strength, tone, and muscle mass   HEENT swelling of left cheek; tender to palpation   Mouth/Teeth mucous membranes moist, pharynx normal without lesions and dental hygiene poor   Neck Left submandibular lymphadenopathy   Cardiovascular: regular rate and rhythm   Respiratory:  appears well, vitals normal, no respiratory distress, acyanotic, normal RR, chest clear, no wheezing, crepitations, rhonchi, normal symmetric air entry   Abdomen: soft, non-tender; bowel sounds normal; no masses,  no organomegaly      Assessment:    Pregnancy: [id number] Patient Active Problem List   Diagnosis Date Noted  . History of heroin abuse 12/21/2015  . Late prenatal care complicating pregnancy 12/21/2015  . Supervision of high risk pregnancy, antepartum 12/21/2015        Plan:  1. Supervision of high risk pregnancy, antepartum  - GC/Chlamydia probe amp ([place])not at [facility] - Cytology - PAP - US MFM OB COMP + 14 WK; Future - Pain Mgmt, Profile 6 Conf w/o mM, U  2. History of heroin abuse  - Pain Mgmt, Profile 6 Conf w/o  mM, U  3. Late prenatal care affecting pregnancy in third trimester  - GC/Chlamydia probe amp ([place] - Cytology - PAP - US MFM OB COMP + 14 WK; Future - Pain Mgmt, Profile 6 Conf w/o mM, U  4. Dental abscess -If symptoms worsen or fever develops; [facility] -Keep scheduled appt with dental clinic - amoxicillin-clavulanate (AUGMENTIN) 875-125 MG tablet; Take 1 tablet by mouth 2 (two) times daily.  Dispense: 20 tablet; Refill: 0 - oxyCODONE-acetaminophen (PERCOCET/ROXICET) 5-325 MG tablet; Take 1-2 tablets by mouth every 6 (six) hours as needed.  Dispense: 10 tablet; Refill: 0  5. Vaginal yeast infection  - terconazole (TERAZOL 3) 0.8 % vaginal cream; Place 1 applicator vaginally at bedtime.  Dispense: 20 g; Refill: 0    Initial labs  drawn. Prenatal vitamins. Problem list reviewed and updated. Genetic Screening discussed -- late to care  Ultrasound discussed; fetal survey: ordered.  Follow up in 2 weeks.    Judeth Hornrin Bryden Darden 12/25/2015

## 2015-12-25 NOTE — Patient Instructions (Signed)
Dental Abscess A dental abscess is a collection of pus in or around a tooth. What are the causes? This condition is caused by a bacterial infection around the root of the tooth that involves the inner part of the tooth (pulp). It may result from:  Severe tooth decay.  Trauma to the tooth that allows bacteria to enter into the pulp, such as a broken or chipped tooth.  Severe gum disease around a tooth.  What are the signs or symptoms? Symptoms of this condition include:  Severe pain in and around the infected tooth.  Swelling and redness around the infected tooth, in the mouth, or in the face.  Tenderness.  Pus drainage.  Bad breath.  Bitter taste in the mouth.  Difficulty swallowing.  Difficulty opening the mouth.  Nausea.  Vomiting.  Chills.  Swollen neck glands.  Fever.  How is this diagnosed? This condition is diagnosed with examination of the infected tooth. During the exam, your dentist may tap on the infected tooth. Your dentist will also ask about your medical and dental history and may order X-rays. How is this treated? This condition is treated by eliminating the infection. This may be done with:  Antibiotic medicine.  A root canal. This may be performed to save the tooth.  Pulling (extracting) the tooth. This may also involve draining the abscess. This is done if the tooth cannot be saved.  Follow these instructions at home:  Take medicines only as directed by your dentist.  If you were prescribed antibiotic medicine, finish all of it even if you start to feel better.  Rinse your mouth (gargle) often with salt water to relieve pain or swelling.  Do not drive or operate heavy machinery while taking pain medicine.  Do not apply heat to the outside of your mouth.  Keep all follow-up visits as directed by your dentist. This is important. Contact a health care provider if:  Your pain is worse and is not helped by medicine. Get help right away  if:  You have a fever or chills.  Your symptoms suddenly get worse.  You have a very bad headache.  You have problems breathing or swallowing.  You have trouble opening your mouth.  You have swelling in your neck or around your eye. This information is not intended to replace advice given to you by your health care provider. Make sure you discuss any questions you have with your health care provider. Document Released: 01/05/2005 Document Revised: 05/16/2015 Document Reviewed: 01/02/2014 Elsevier Interactive Patient Education  2017 Elsevier Inc.  

## 2015-12-25 NOTE — Progress Notes (Signed)
Here for initial prenatal visit. States still having a lot of dental pain, has appt in 2 weeks at dental clinic. Declines flu shot.  Declines tdap. Given new patient education booklets.

## 2015-12-26 ENCOUNTER — Ambulatory Visit (HOSPITAL_COMMUNITY)
Admission: RE | Admit: 2015-12-26 | Discharge: 2015-12-26 | Disposition: A | Discharge status: Home / Self Care | Payer: Medicaid Other | Source: Ambulatory Visit | Attending: Student | Admitting: Student

## 2015-12-26 ENCOUNTER — Encounter (HOSPITAL_COMMUNITY): Payer: Self-pay

## 2015-12-26 ENCOUNTER — Other Ambulatory Visit (HOSPITAL_COMMUNITY): Payer: Self-pay | Admitting: *Deleted

## 2015-12-26 DIAGNOSIS — F1120 Opioid dependence, uncomplicated: Secondary | ICD-10-CM | POA: Insufficient documentation

## 2015-12-26 DIAGNOSIS — Z3A29 29 weeks gestation of pregnancy: Secondary | ICD-10-CM | POA: Insufficient documentation

## 2015-12-26 DIAGNOSIS — O0990 Supervision of high risk pregnancy, unspecified, unspecified trimester: Secondary | ICD-10-CM

## 2015-12-26 DIAGNOSIS — O99323 Drug use complicating pregnancy, third trimester: Secondary | ICD-10-CM | POA: Diagnosis present

## 2015-12-26 DIAGNOSIS — O99333 Smoking (tobacco) complicating pregnancy, third trimester: Secondary | ICD-10-CM | POA: Diagnosis not present

## 2015-12-26 DIAGNOSIS — O99320 Drug use complicating pregnancy, unspecified trimester: Principal | ICD-10-CM

## 2015-12-26 DIAGNOSIS — Z363 Encounter for antenatal screening for malformations: Secondary | ICD-10-CM | POA: Diagnosis not present

## 2015-12-26 DIAGNOSIS — O09293 Supervision of pregnancy with other poor reproductive or obstetric history, third trimester: Secondary | ICD-10-CM | POA: Insufficient documentation

## 2015-12-26 DIAGNOSIS — O0933 Supervision of pregnancy with insufficient antenatal care, third trimester: Secondary | ICD-10-CM

## 2015-12-26 DIAGNOSIS — F1111 Opioid abuse, in remission: Secondary | ICD-10-CM

## 2015-12-26 LAB — GC/CHLAMYDIA PROBE AMP (~~LOC~~) NOT AT ARMC
Chlamydia: NEGATIVE
NEISSERIA GONORRHEA: NEGATIVE

## 2015-12-27 ENCOUNTER — Institutional Professional Consult (permissible substitution): Payer: Self-pay

## 2015-12-30 LAB — CYTOLOGY - PAP
Diagnosis: NEGATIVE
HPV (WINDOPATH): NOT DETECTED

## 2015-12-31 LAB — PAIN MGMT, PROFILE 6 CONF W/O MM, U
6 Acetylmorphine: NEGATIVE ng/mL (ref <10)
ALCOHOL METABOLITES: NEGATIVE ng/mL (ref <500)
Amphetamines: NEGATIVE ng/mL (ref <500)
Barbiturates: NEGATIVE ng/mL (ref <300)
Benzodiazepines: NEGATIVE ng/mL (ref <100)
COCAINE METABOLITE: NEGATIVE ng/mL (ref <150)
CODEINE: NEGATIVE ng/mL (ref <50)
CREATININE: 161.8 mg/dL (ref >=20.0)
HYDROCODONE: NEGATIVE ng/mL (ref <50)
Hydromorphone: NEGATIVE ng/mL (ref <50)
MARIJUANA METABOLITE: NEGATIVE ng/mL (ref <20)
Methadone Metabolite: NEGATIVE ng/mL (ref <100)
Morphine: 7003 ng/mL — ABNORMAL HIGH (ref <50)
NORHYDROCODONE: NEGATIVE ng/mL (ref <50)
OXYCODONE: NEGATIVE ng/mL (ref <100)
Opiates: POSITIVE ng/mL — AB (ref <100)
Oxidant: NEGATIVE ug/mL (ref <200)
PH: 6.87 (ref 4.5–9.0)
PLEASE NOTE: 0
Phencyclidine: NEGATIVE ng/mL (ref <25)

## 2016-01-09 ENCOUNTER — Encounter: Payer: Self-pay | Admitting: Obstetrics & Gynecology

## 2016-01-20 NOTE — L&D Delivery Note (Signed)
Delivery Note At 4:08 PM a viable female was delivered via  OA to LOT.  APGAR:8,9 ; weight .   Placenta status: spontaneous via schultz presentation .  Cord: 3 vessels with the following complications: loose nuchal chord which was easily reduced.  Cord pH: NA  Anesthesia:  Epidural and local Episiotomy:  none Lacerations:  1st degree left labial Suture Repair: 2.0 Vicryl Est. Blood Loss (mL):  200   Mom to postpartum.  Baby to Nursery.  Baird KayKathryn Travius Crochet 03/01/2016, 4:26 PM

## 2016-01-23 ENCOUNTER — Encounter (HOSPITAL_COMMUNITY): Payer: Self-pay

## 2016-01-23 ENCOUNTER — Ambulatory Visit (HOSPITAL_COMMUNITY)
Admission: RE | Admit: 2016-01-23 | Discharge: 2016-01-23 | Disposition: A | Discharge status: Home / Self Care | Payer: Medicaid Other | Source: Ambulatory Visit | Attending: Student | Admitting: Student

## 2016-01-28 ENCOUNTER — Encounter: Payer: Self-pay | Admitting: Obstetrics and Gynecology

## 2016-01-28 NOTE — Progress Notes (Signed)
Patient did not keep OB appointment for 01/28/2016.  Cornelia Copaharlie Orianna Biskup, Jr MD Attending [facility](Faculty Practice)

## 2016-02-07 ENCOUNTER — Other Ambulatory Visit: Payer: Self-pay | Admitting: Family Medicine

## 2016-02-07 ENCOUNTER — Encounter: Payer: Self-pay | Admitting: Family Medicine

## 2016-02-07 DIAGNOSIS — O35EXX0 Maternal care for other (suspected) fetal abnormality and damage, fetal genitourinary anomalies, not applicable or unspecified: Secondary | ICD-10-CM | POA: Insufficient documentation

## 2016-02-07 DIAGNOSIS — O358XX0 Maternal care for other (suspected) fetal abnormality and damage, not applicable or unspecified: Secondary | ICD-10-CM | POA: Insufficient documentation

## 2016-03-01 ENCOUNTER — Inpatient Hospital Stay (HOSPITAL_COMMUNITY): Payer: Medicaid Other | Admitting: Anesthesiology

## 2016-03-01 ENCOUNTER — Inpatient Hospital Stay (HOSPITAL_COMMUNITY)
Admission: AD | Admit: 2016-03-01 | Discharge: 2016-03-03 | DRG: 775 | Disposition: A | Discharge status: Home / Self Care | Payer: Medicaid Other | Source: Ambulatory Visit | Attending: Obstetrics & Gynecology | Admitting: Obstetrics & Gynecology

## 2016-03-01 ENCOUNTER — Encounter (HOSPITAL_COMMUNITY): Payer: Self-pay | Admitting: *Deleted

## 2016-03-01 DIAGNOSIS — O99334 Smoking (tobacco) complicating childbirth: Secondary | ICD-10-CM | POA: Diagnosis present

## 2016-03-01 DIAGNOSIS — F17210 Nicotine dependence, cigarettes, uncomplicated: Secondary | ICD-10-CM | POA: Diagnosis present

## 2016-03-01 DIAGNOSIS — Z3A37 37 weeks gestation of pregnancy: Secondary | ICD-10-CM

## 2016-03-01 DIAGNOSIS — O99320 Drug use complicating pregnancy, unspecified trimester: Secondary | ICD-10-CM | POA: Diagnosis not present

## 2016-03-01 DIAGNOSIS — Z833 Family history of diabetes mellitus: Secondary | ICD-10-CM

## 2016-03-01 DIAGNOSIS — O358XX0 Maternal care for other (suspected) fetal abnormality and damage, not applicable or unspecified: Secondary | ICD-10-CM

## 2016-03-01 DIAGNOSIS — O35EXX0 Maternal care for other (suspected) fetal abnormality and damage, fetal genitourinary anomalies, not applicable or unspecified: Secondary | ICD-10-CM

## 2016-03-01 DIAGNOSIS — O0990 Supervision of high risk pregnancy, unspecified, unspecified trimester: Secondary | ICD-10-CM

## 2016-03-01 DIAGNOSIS — O0933 Supervision of pregnancy with insufficient antenatal care, third trimester: Secondary | ICD-10-CM

## 2016-03-01 DIAGNOSIS — Z3493 Encounter for supervision of normal pregnancy, unspecified, third trimester: Secondary | ICD-10-CM | POA: Diagnosis present

## 2016-03-01 DIAGNOSIS — O872 Hemorrhoids in the puerperium: Secondary | ICD-10-CM | POA: Diagnosis not present

## 2016-03-01 DIAGNOSIS — F1110 Opioid abuse, uncomplicated: Secondary | ICD-10-CM | POA: Diagnosis present

## 2016-03-01 DIAGNOSIS — O99324 Drug use complicating childbirth: Secondary | ICD-10-CM | POA: Diagnosis present

## 2016-03-01 DIAGNOSIS — F1111 Opioid abuse, in remission: Secondary | ICD-10-CM

## 2016-03-01 LAB — CBC
HEMATOCRIT: 35.1 % — AB (ref 36.0–46.0)
Hemoglobin: 12 g/dL (ref 12.0–15.0)
MCH: 28.6 pg (ref 26.0–34.0)
MCHC: 34.2 g/dL (ref 30.0–36.0)
MCV: 83.6 fL (ref 78.0–100.0)
Platelets: 285 10*3/uL (ref 150–400)
RBC: 4.2 MIL/uL (ref 3.87–5.11)
RDW: 14.1 % (ref 11.5–15.5)
WBC: 11.4 10*3/uL — AB (ref 4.0–10.5)

## 2016-03-01 LAB — TYPE AND SCREEN
ABO/RH(D): O POS
ANTIBODY SCREEN: NEGATIVE

## 2016-03-01 LAB — URINALYSIS, ROUTINE W REFLEX MICROSCOPIC
Bilirubin Urine: NEGATIVE
Glucose, UA: NEGATIVE mg/dL
Ketones, ur: 5 mg/dL — AB
Leukocytes, UA: NEGATIVE
Nitrite: NEGATIVE
Protein, ur: NEGATIVE mg/dL
Specific Gravity, Urine: 1.005 (ref 1.005–1.030)
pH: 7 (ref 5.0–8.0)

## 2016-03-01 LAB — RAPID URINE DRUG SCREEN, HOSP PERFORMED
AMPHETAMINES: NOT DETECTED
Barbiturates: NOT DETECTED
Benzodiazepines: NOT DETECTED
COCAINE: NOT DETECTED
OPIATES: NOT DETECTED
TETRAHYDROCANNABINOL: NOT DETECTED

## 2016-03-01 MED ORDER — OXYTOCIN 40 UNITS IN LACTATED RINGERS INFUSION - SIMPLE MED
2.5000 [IU]/h | INTRAVENOUS | Status: DC
  Administered 2016-03-01: 2.5 [IU]/h via INTRAVENOUS
  Filled 2016-03-01: qty 1000

## 2016-03-01 MED ORDER — PRENATAL MULTIVITAMIN CH
1.0000 | ORAL_TABLET | Freq: Every day | ORAL | Status: DC
  Administered 2016-03-02 – 2016-03-03 (×2): 1 via ORAL
  Filled 2016-03-01 (×2): qty 1

## 2016-03-01 MED ORDER — LACTATED RINGERS IV SOLN
INTRAVENOUS | Status: DC
  Administered 2016-03-01 (×2): 125 mL/h via INTRAVENOUS

## 2016-03-01 MED ORDER — DIBUCAINE 1 % RE OINT: 1.0000 "application " | TOPICAL_OINTMENT | RECTAL | Status: DC | PRN

## 2016-03-01 MED ORDER — SIMETHICONE 80 MG PO CHEW: 80.0000 mg | CHEWABLE_TABLET | ORAL | Status: DC | PRN

## 2016-03-01 MED ORDER — ACETAMINOPHEN 325 MG PO TABS
650.0000 mg | ORAL_TABLET | ORAL | Status: DC | PRN
  Administered 2016-03-02: 650 mg via ORAL
  Filled 2016-03-01: qty 2

## 2016-03-01 MED ORDER — COCONUT OIL OIL: 1.0000 "application " | TOPICAL_OIL | Status: DC | PRN

## 2016-03-01 MED ORDER — FLEET ENEMA 7-19 GM/118ML RE ENEM: 1.0000 | ENEMA | RECTAL | Status: DC | PRN

## 2016-03-01 MED ORDER — ACETAMINOPHEN 325 MG PO TABS
650.0000 mg | ORAL_TABLET | ORAL | Status: DC | PRN
Start: 2016-03-01 — End: 2016-03-01

## 2016-03-01 MED ORDER — ONDANSETRON HCL 4 MG/2ML IJ SOLN: 4.0000 mg | INTRAMUSCULAR | Status: DC | PRN

## 2016-03-01 MED ORDER — ONDANSETRON HCL 4 MG PO TABS: 4.0000 mg | ORAL_TABLET | ORAL | Status: DC | PRN

## 2016-03-01 MED ORDER — FENTANYL 2.5 MCG/ML BUPIVACAINE 1/10 % EPIDURAL INFUSION (WH - ANES)
14.0000 mL/h | INTRAMUSCULAR | Status: DC | PRN
  Administered 2016-03-01 (×2): 14 mL/h via EPIDURAL
  Filled 2016-03-01: qty 100

## 2016-03-01 MED ORDER — SENNOSIDES-DOCUSATE SODIUM 8.6-50 MG PO TABS
2.0000 | ORAL_TABLET | ORAL | Status: DC
  Administered 2016-03-02 – 2016-03-03 (×2): 2 via ORAL
  Filled 2016-03-01 (×2): qty 2

## 2016-03-01 MED ORDER — BUPRENORPHINE HCL 8 MG SL SUBL
8.0000 mg | SUBLINGUAL_TABLET | Freq: Two times a day (BID) | SUBLINGUAL | Status: DC
  Administered 2016-03-01 – 2016-03-03 (×4): 8 mg via SUBLINGUAL
  Filled 2016-03-01 (×4): qty 1

## 2016-03-01 MED ORDER — TERBUTALINE SULFATE 1 MG/ML IJ SOLN
0.2500 mg | Freq: Once | INTRAMUSCULAR | Status: DC | PRN
  Filled 2016-03-01: qty 1

## 2016-03-01 MED ORDER — LACTATED RINGERS IV SOLN: 500.0000 mL | INTRAVENOUS | Status: DC | PRN

## 2016-03-01 MED ORDER — OXYTOCIN BOLUS FROM INFUSION
500.0000 mL | Freq: Once | INTRAVENOUS | Status: AC
  Administered 2016-03-01: 500 mL via INTRAVENOUS

## 2016-03-01 MED ORDER — PHENYLEPHRINE 40 MCG/ML (10ML) SYRINGE FOR IV PUSH (FOR BLOOD PRESSURE SUPPORT)
80.0000 ug | PREFILLED_SYRINGE | INTRAVENOUS | Status: DC | PRN
  Filled 2016-03-01: qty 10
  Filled 2016-03-01: qty 5

## 2016-03-01 MED ORDER — ONDANSETRON HCL 4 MG/2ML IJ SOLN: 4.0000 mg | Freq: Four times a day (QID) | INTRAMUSCULAR | Status: DC | PRN

## 2016-03-01 MED ORDER — OXYCODONE-ACETAMINOPHEN 5-325 MG PO TABS: 1.0000 | ORAL_TABLET | ORAL | Status: DC | PRN

## 2016-03-01 MED ORDER — INFLUENZA VAC SPLIT QUAD 0.5 ML IM SUSY
0.5000 mL | PREFILLED_SYRINGE | INTRAMUSCULAR | Status: AC
  Administered 2016-03-02: 0.5 mL via INTRAMUSCULAR
  Filled 2016-03-01: qty 0.5

## 2016-03-01 MED ORDER — OXYCODONE-ACETAMINOPHEN 5-325 MG PO TABS: 2.0000 | ORAL_TABLET | ORAL | Status: DC | PRN

## 2016-03-01 MED ORDER — EPHEDRINE 5 MG/ML INJ
10.0000 mg | INTRAVENOUS | Status: DC | PRN
  Filled 2016-03-01: qty 4

## 2016-03-01 MED ORDER — LIDOCAINE HCL (PF) 1 % IJ SOLN
30.0000 mL | INTRAMUSCULAR | Status: DC | PRN
Start: 2016-03-01 — End: 2016-03-01
  Administered 2016-03-01: 30 mL via SUBCUTANEOUS
  Filled 2016-03-01: qty 30

## 2016-03-01 MED ORDER — TETANUS-DIPHTH-ACELL PERTUSSIS 5-2.5-18.5 LF-MCG/0.5 IM SUSP
0.5000 mL | Freq: Once | INTRAMUSCULAR | Status: AC
  Administered 2016-03-02: 0.5 mL via INTRAMUSCULAR
  Filled 2016-03-01: qty 0.5

## 2016-03-01 MED ORDER — WITCH HAZEL-GLYCERIN EX PADS: 1.0000 "application " | MEDICATED_PAD | CUTANEOUS | Status: DC | PRN

## 2016-03-01 MED ORDER — DIPHENHYDRAMINE HCL 25 MG PO CAPS: 25.0000 mg | ORAL_CAPSULE | Freq: Four times a day (QID) | ORAL | Status: DC | PRN

## 2016-03-01 MED ORDER — PHENYLEPHRINE 40 MCG/ML (10ML) SYRINGE FOR IV PUSH (FOR BLOOD PRESSURE SUPPORT)
80.0000 ug | PREFILLED_SYRINGE | INTRAVENOUS | Status: DC | PRN
  Filled 2016-03-01: qty 5

## 2016-03-01 MED ORDER — SOD CITRATE-CITRIC ACID 500-334 MG/5ML PO SOLN
30.0000 mL | ORAL | Status: DC | PRN
Start: 2016-03-01 — End: 2016-03-01

## 2016-03-01 MED ORDER — LACTATED RINGERS IV SOLN: 500.0000 mL | Freq: Once | INTRAVENOUS | Status: DC

## 2016-03-01 MED ORDER — POLYETHYLENE GLYCOL 3350 17 G PO PACK
17.0000 g | PACK | Freq: Every day | ORAL | Status: DC
  Administered 2016-03-02 – 2016-03-03 (×2): 17 g via ORAL
  Filled 2016-03-01 (×3): qty 1

## 2016-03-01 MED ORDER — DIPHENHYDRAMINE HCL 50 MG/ML IJ SOLN: 12.5000 mg | INTRAMUSCULAR | Status: DC | PRN

## 2016-03-01 MED ORDER — OXYTOCIN 40 UNITS IN LACTATED RINGERS INFUSION - SIMPLE MED
1.0000 m[IU]/min | INTRAVENOUS | Status: DC
  Administered 2016-03-01: 2 m[IU]/min via INTRAVENOUS
  Filled 2016-03-01: qty 1000

## 2016-03-01 MED ORDER — IBUPROFEN 600 MG PO TABS
600.0000 mg | ORAL_TABLET | Freq: Four times a day (QID) | ORAL | Status: DC
  Administered 2016-03-01 – 2016-03-03 (×8): 600 mg via ORAL
  Filled 2016-03-01 (×8): qty 1

## 2016-03-01 MED ORDER — LIDOCAINE HCL (PF) 1 % IJ SOLN
INTRAMUSCULAR | Status: DC | PRN
  Administered 2016-03-01: 8 mL via EPIDURAL
  Administered 2016-03-01: 7 mL via EPIDURAL

## 2016-03-01 MED ORDER — BENZOCAINE-MENTHOL 20-0.5 % EX AERO
1.0000 "application " | INHALATION_SPRAY | CUTANEOUS | Status: DC | PRN
  Administered 2016-03-03: 1 via TOPICAL
  Filled 2016-03-01: qty 56

## 2016-03-01 NOTE — Anesthesia Procedure Notes (Signed)
Epidural

## 2016-03-01 NOTE — Progress Notes (Signed)
Wilmon Armsshley E Gantt is a 31 y.o. [phone] at 3440w5d by ultrasound admitted for active labor  Subjective:   Objective: BP (!) 116/51   Pulse (!) 124   Temp 98.1 F (36.7 C) (Oral)   Resp 17   Ht 5\' 2"  (1.575 m)   Wt 81.6 kg (180 lb)   LMP 08/05/2015 (Approximate) Comment: dating by scan   SpO2 98%   BMI 32.92 kg/m  No intake/output data recorded. Total I/O In: -  Out: 325 [Urine:325]  FHT:  FHR: 150 bpm, variability: minimal ,  accelerations:  Abscent,  decelerations:  Absent UC:   regular, every 2-3 minutes SVE:   Dilation: 6 Effacement (%): 90 Station: -2 Exam by:: s grindstaff rn  Labs: Lab Results  Component Value Date   WBC 11.4 (H) 03/01/2016   HGB 12.0 03/01/2016   HCT 35.1 (L) 03/01/2016   MCV 83.6 03/01/2016   PLT 285 03/01/2016    Assessment / Plan: Augmentation of labor, progressing well  Labor: Progressing on Pitocin, will continue to increase then AROM Preeclampsia:  no s/s of preeclampsia Fetal Wellbeing:  Category II Pain Control:  Epidural I/D:  n/a Anticipated MOD:  NSVD  Baird KayKathryn [place] 03/01/2016, 2:40 PM

## 2016-03-01 NOTE — Anesthesia Preprocedure Evaluation (Addendum)
Anesthesia Evaluation  Patient identified by MRN, date of birth, ID band Patient awake    Reviewed: Allergy & Precautions, H&P , NPO status , Patient's Chart, lab work & pertinent test results  Airway Mallampati: II  TM Distance: >3 FB Neck ROM: full    Dental no notable dental hx.    Pulmonary Current Smoker,    Pulmonary exam normal        Cardiovascular hypertension, Normal cardiovascular exam     Neuro/Psych negative neurological ROS  negative psych ROS   GI/Hepatic negative GI ROS, Neg liver ROS,   Endo/Other  negative endocrine ROS  Renal/GU      Musculoskeletal   Abdominal (+) + obese,   Peds  Hematology negative hematology ROS (+)   Anesthesia Other Findings   Reproductive/Obstetrics (+) Pregnancy                             Anesthesia Physical Anesthesia Plan  ASA: II  Anesthesia Plan: Epidural   Post-op Pain Management:    Induction:   Airway Management Planned:   Additional Equipment:   Intra-op Plan:   Post-operative Plan:   Informed Consent: I have reviewed the patients History and Physical, chart, labs and discussed the procedure including the risks, benefits and alternatives for the proposed anesthesia with the patient or authorized representative who has indicated his/her understanding and acceptance.     Plan Discussed with:   Anesthesia Plan Comments:         Anesthesia Quick Evaluation

## 2016-03-01 NOTE — Progress Notes (Signed)
Mikayla Williamson is a 31 y.o. [phone] at 5612w5d by ultrasound admitted for active labor  Subjective:   Objective: BP 117/62   Pulse (!) 109   Temp 98.2 F (36.8 C) (Oral)   Resp 20   Ht 5\' 2"  (1.575 m)   Wt 81.6 kg (180 lb)   LMP 08/05/2015 (Approximate) Comment: dating by scan   SpO2 98%   BMI 32.92 kg/m  No intake/output data recorded. No intake/output data recorded.  FHT:  FHR: 130 bpm, variability: moderate,  accelerations:  Present,  decelerations:  Absent UC:   regular, every 2-3 minutes SVE:   Dilation: 6 Effacement (%): 90 Station: -2 Exam by:: C Estate agentManchester Student MW  Labs: Lab Results  Component Value Date   WBC 11.4 (H) 03/01/2016   HGB 12.0 03/01/2016   HCT 35.1 (L) 03/01/2016   MCV 83.6 03/01/2016   PLT 285 03/01/2016    Assessment / Plan: Spontaneous labor, progressing normally  Labor: Progressing normally Preeclampsia:  none Fetal Wellbeing:  Category I Pain Control:  Epidural I/D:  n/a Anticipated MOD:  NSVD  Mikayla Williamson 03/01/2016, 1:01 PM

## 2016-03-01 NOTE — H&P (Signed)
Mikayla Williamson is a 31 y.o. female presenting for labor, contractions since 3am. Limited PNC (4 visitis), on Subutex. Only one Prenatal visit. OB History    Gravida Para Term Preterm AB Living   5 3 3  0 1 3   SAB TAB Ectopic Multiple Live Births   1 0 0   2     Past Medical History:  Diagnosis Date  . History of heroin abuse    subutex in [place]  . Pregnancy induced hypertension 2007  . Vaginal Pap smear, abnormal 2005   cervical biopsy negative   Past Surgical History:  Procedure Laterality Date  . CHOLECYSTECTOMY    . COLPOSCOPY  2006   Family History: family history includes Diabetes in her father. Social History:  reports that she has been smoking Cigarettes.  She has been smoking about 0.50 packs per day. She has never used smokeless tobacco. She reports that she does not drink alcohol or use drugs.     Maternal Diabetes: No Genetic Screening: Normal Maternal Ultrasounds/Referrals: Normal Fetal Ultrasounds or other Referrals:  None Maternal Substance Abuse:  Yes:  Type: Smoker, Other: Heroin Significant Maternal Medications:  Meds include: Other: Subutex Significant Maternal Lab Results:  None Other Comments:  None  Review of Systems  Constitutional: Negative.   HENT: Negative.   Eyes: Negative.   Respiratory: Negative.   Cardiovascular: Positive for leg swelling.  Gastrointestinal: Positive for abdominal pain.  Genitourinary: Negative.   Musculoskeletal: Negative.   Skin: Negative.   Neurological: Negative.   Endo/Heme/Allergies: Negative.   Psychiatric/Behavioral: Positive for substance abuse.   Maternal Medical History:  Reason for admission: Contractions.   Contractions: Onset was 6-12 hours ago.   Frequency: regular.   Duration is approximately 60 seconds.   Perceived severity is strong.    Fetal activity: Perceived fetal activity is normal.   Last perceived fetal movement was within the past hour.    Prenatal complications: Substance abuse.    Prenatal Complications - Diabetes: none.    Dilation: 5 Effacement (%): 80 Station: -1 Exam by:: [employer] RN Blood pressure 126/80, pulse 111, temperature 98 F (36.7 C), resp. rate 18, height 5\' 2"  (1.575 m), weight 81.6 kg (180 lb), last menstrual period 08/05/2015, unknown if currently breastfeeding. Maternal Exam:  Uterine Assessment: Contraction strength is moderate.  Contraction duration is 60 seconds. Contraction frequency is regular.   Abdomen: Patient reports no abdominal tenderness. Estimated fetal weight is 7 lbs.   Fetal presentation: vertex  Introitus: Normal vulva. Normal vagina.  Ferning test: not done.  Nitrazine test: not done. Amniotic fluid character: not assessed.  Pelvis: adequate for delivery.   Cervix: not evaluated.   Physical Exam  Constitutional: She is oriented to person, place, and time. She appears well-developed.  HENT:  Head: Normocephalic and atraumatic.  Eyes: Pupils are equal, round, and reactive to light.  Neck: Normal range of motion.  Cardiovascular: Normal rate, regular rhythm and normal heart sounds.   Respiratory: Effort normal and breath sounds normal.  GI: Soft.  Genitourinary: Vagina normal and uterus normal.  Musculoskeletal: Normal range of motion.  Neurological: She is alert and oriented to person, place, and time.  Skin: Skin is warm and dry.  Psychiatric: She has a normal mood and affect.    Prenatal labs: ABO, Rh: --/--/O POS, O POS (11/29 1913) Antibody: NEG (11/29 1913) Rubella: 4.11 (11/29 1913) RPR: Non Reactive (11/29 1913)  HBsAg: Negative (11/29 1913)  HIV: Non Reactive (11/29 1913)  GBS:  unknown  Assessment/Plan: Active labor. Patient admitted, anticipate NSVD. GBS is unknown.   Baird KayKathryn [place] 03/01/2016, 9:44 AM

## 2016-03-01 NOTE — Lactation Note (Signed)
This note was copied from a baby's chart. Lactation Consultation Note  Patient Name: Mikayla Williamson  Baby at 4 hr of life. RN reported to lactation that she was trying to help mom latch baby when Mom told her that she no longer desires to bf. She is not interested in pumping. She did not bf her other children.    Maternal Data    Feeding Feeding Type: Breast Fed  LATCH Score/Interventions                      Lactation Tools Discussed/Used     Consult Status Consult Status: Complete    [name], 8:41 PM

## 2016-03-01 NOTE — Anesthesia Procedure Notes (Signed)
Epidural Patient location during procedure: OB Start time: 03/01/2016 11:15 AM End time: 03/01/2016 11:18 AM  Staffing Anesthesiologist: Leilani AbleHATCHETT, Lazar Tierce Performed: anesthesiologist   Preanesthetic Checklist Completed: patient identified, surgical consent, pre-op evaluation, timeout performed, IV checked, risks and benefits discussed and monitors and equipment checked  Epidural Patient position: sitting Prep: site prepped and draped and DuraPrep Patient monitoring: continuous pulse ox and blood pressure Approach: midline Location: L3-L4 Injection technique: LOR air  Needle:  Needle type: Tuohy  Needle gauge: 17 G Needle length: 9 cm and 9 Needle insertion depth: 7 cm Catheter type: closed end flexible Catheter size: 19 Gauge Catheter at skin depth: 12 cm Test dose: negative and Other  Assessment Sensory level: T9 Events: blood not aspirated, injection not painful, no injection resistance, negative IV test and no paresthesia  Additional Notes Reason for block:procedure for pain

## 2016-03-01 NOTE — MAU Note (Signed)
Pt presents to MAU with complaints of contractions that started this morning around 3 am. Denies any vaginal bleeding or LOF

## 2016-03-02 LAB — CBC
HCT: 30.3 % — ABNORMAL LOW (ref 36.0–46.0)
Hemoglobin: 10.2 g/dL — ABNORMAL LOW (ref 12.0–15.0)
MCH: 28.5 pg (ref 26.0–34.0)
MCHC: 33.7 g/dL (ref 30.0–36.0)
MCV: 84.6 fL (ref 78.0–100.0)
Platelets: 234 10*3/uL (ref 150–400)
RBC: 3.58 MIL/uL — ABNORMAL LOW (ref 3.87–5.11)
RDW: 14.2 % (ref 11.5–15.5)
WBC: 12.1 10*3/uL — ABNORMAL HIGH (ref 4.0–10.5)

## 2016-03-02 LAB — RPR: RPR: NONREACTIVE

## 2016-03-02 MED ORDER — HYDROCORTISONE ACETATE 25 MG RE SUPP
25.0000 mg | Freq: Two times a day (BID) | RECTAL | Status: DC
  Administered 2016-03-02 – 2016-03-03 (×3): 25 mg via RECTAL
  Filled 2016-03-02 (×5): qty 1

## 2016-03-02 NOTE — Plan of Care (Signed)
Problem: Activity: Goal: Will verbalize the importance of balancing activity with adequate rest periods Outcome: Completed/Met Date Met: 03/02/16 Encouraged increased ambulation, po fluids and emptying bladder frequently to assist in fluid in feet.

## 2016-03-02 NOTE — Anesthesia Postprocedure Evaluation (Signed)
Anesthesia Post Note  Patient: Mikayla Williamson  Procedure(s) Performed: * No procedures listed *  Patient location during evaluation: Mother Baby Anesthesia Type: Epidural Level of consciousness: awake, awake and alert and oriented Pain management: pain level controlled Vital Signs Assessment: post-procedure vital signs reviewed and stable Respiratory status: spontaneous breathing and nonlabored ventilation Cardiovascular status: stable Postop Assessment: no headache, no backache, epidural receding, patient able to bend at knees, no signs of nausea or vomiting and adequate PO intake Anesthetic complications: no        Last Vitals:  Vitals:   03/02/16 0300 03/02/16 0532  BP: 119/61 125/84  Pulse: 88 87  Resp: 18 18  Temp:  36.4 C    Last Pain:  Vitals:   03/02/16 0532  TempSrc: Oral  PainSc:    Pain Goal:                 Laban EmperorMalinova,Mahaley Schwering Hristova

## 2016-03-02 NOTE — Progress Notes (Signed)
Post Partum Day #1 Subjective: no complaints, up ad lib, voiding and tolerating PO.  Reports hemorrhoids.   Objective: Blood pressure 125/84, pulse 87, temperature 97.5 F (36.4 C), temperature source Oral, resp. rate 18, height 5\' 2"  (1.575 m), weight 180 lb (81.6 kg), last menstrual period 08/05/2015, SpO2 98 %, unknown if currently breastfeeding.  Physical Exam:  General: alert, cooperative and no distress Lochia: appropriate Uterine Fundus: firm Incision: no significant drainage, no significant erythema DVT Evaluation: No evidence of DVT seen on physical exam. No cords or calf tenderness. No significant calf/ankle edema.   Recent Labs  03/01/16 0952 03/02/16 0635  HGB 12.0 10.2*  HCT 35.1* 30.3*    Assessment/Plan: Plan for discharge tomorrow, Social Work consult and Contraception undecided.   LOS: 1 day   Roe CoombsRachelle A Windell Musson, CNM 03/02/2016, 7:38 AM

## 2016-03-03 DIAGNOSIS — O99320 Drug use complicating pregnancy, unspecified trimester: Secondary | ICD-10-CM

## 2016-03-03 DIAGNOSIS — Z3A37 37 weeks gestation of pregnancy: Secondary | ICD-10-CM

## 2016-03-03 LAB — CULTURE, BETA STREP (GROUP B ONLY)

## 2016-03-03 MED ORDER — IBUPROFEN 600 MG PO TABS: 600.0000 mg | ORAL_TABLET | Freq: Four times a day (QID) | ORAL | 2 refills | Status: AC

## 2016-03-03 NOTE — Progress Notes (Signed)
Psychosocial Assessment completed.  CSW identifies no barriers to discharge when MOB and baby are ready.  Full documentation to follow. 

## 2016-03-03 NOTE — Progress Notes (Signed)
CSW attempted to meet with MOB, but she had visitors this morning.  She requests that CSW return around 10am.

## 2016-03-03 NOTE — Discharge Summary (Signed)
OB Discharge Summary     Patient Name: Mikayla Williamson DOB: 04-Jun-1985 MRN: [id number]  Date of admission: 03/01/2016 Delivering MD: Myles Lipps   Date of discharge: 03/03/2016  Admitting diagnosis: IN LABOR Intrauterine pregnancy: [redacted]w[redacted]d     Secondary diagnosis:  Active Problems:   Active preterm labor  Additional problems: MAAC-Subutex, limited prenatal care     Discharge diagnosis: Term Pregnancy Delivered                                                                                                Post partum procedures:none  Augmentation: Pitocin  Complications: None  Hospital course:  Onset of Labor With Vaginal Delivery     31 y.o. yo [id number] at [redacted]w[redacted]d was admitted in Active Labor on 03/01/2016. Patient had an uncomplicated labor course as follows:  Membrane Rupture Time/Date: 3:57 PM ,03/01/2016   Intrapartum Procedures: Episiotomy: None [1]                                         Lacerations:  Labial [10]  Patient had a delivery of a Viable infant. 03/01/2016  Information for the patient's newborn:  Sieara, Bremer [[id number]]  Delivery Method: Vaginal, Spontaneous Delivery (Filed from Delivery Summary)    Pateint had an uncomplicated postpartum course.  She is ambulating, tolerating a regular diet, passing flatus, and urinating well. Patient is discharged home in stable condition on 03/03/16.   Physical exam  Vitals:   03/02/16 0300 03/02/16 0532 03/02/16 1718 03/03/16 0630  BP: 119/61 125/84 129/75 117/64  Pulse: 88 87 82 86  Resp: 18 18 18 18   Temp:  97.5 F (36.4 C) 97.8 F (36.6 C) 97.8 F (36.6 C)  TempSrc:  Oral Oral Oral  SpO2:      Weight:      Height:       General: alert, cooperative and no distress Lochia: appropriate Uterine Fundus: firm Incision: N/A, left labial DVT Evaluation: No evidence of DVT seen on physical exam. No cords or calf tenderness. No significant calf/ankle edema. Labs: Lab Results  Component Value Date    WBC 12.1 (H) 03/02/2016   HGB 10.2 (L) 03/02/2016   HCT 30.3 (L) 03/02/2016   MCV 84.6 03/02/2016   PLT 234 03/02/2016   CMP Latest Ref Rng & Units 12/18/2015  Glucose 65 - 99 mg/dL 84  BUN 6 - 20 mg/dL 8  Creatinine 1.30 - 8.65 mg/dL 7.84  Sodium 696 - 295 mmol/L 136  Potassium 3.5 - 5.1 mmol/L 3.7  Chloride 101 - 111 mmol/L 102  CO2 22 - 32 mmol/L 25  Calcium 8.9 - 10.3 mg/dL 2.8(U)  Total Protein 6.5 - 8.1 g/dL 7.8  Total Bilirubin 0.3 - 1.2 mg/dL 0.7  Alkaline Phos 38 - 126 U/L 122  AST 15 - 41 U/L 25  ALT 14 - 54 U/L 27    Discharge instruction: per After Visit Summary and "Baby and Me Booklet".  After visit meds:  Allergies as  of 03/03/2016   No Known Allergies     Medication List    STOP taking these medications   calcium carbonate 500 MG chewable tablet Commonly known as:  TUMS - dosed in mg elemental calcium     TAKE these medications   buprenorphine 8 MG Subl SL tablet Commonly known as:  SUBUTEX Place 8 mg under the tongue 2 (two) times daily.   ibuprofen 600 MG tablet Commonly known as:  ADVIL,MOTRIN Take 1 tablet (600 mg total) by mouth every 6 (six) hours.       Diet: routine diet  Activity: Advance as tolerated. Pelvic rest for 6 weeks.   Outpatient follow up:6 weeks Follow up Appt:Future Appointments Date Time Provider Department Center  04/14/2016 8:40 AM Dorathy KinsmanVirginia Smith, CNM [facility]   Follow up Visit:No Follow-up on file.  Postpartum contraception: Undecided  Newborn Data: Live born female  Birth Weight: 7 lb 12 oz (3515 g) APGAR: 8, 9  Baby Feeding: Bottle Disposition:rooming in   03/03/2016 Roe Coombsachelle A Deolinda Frid, CNM

## 2016-03-03 NOTE — Progress Notes (Addendum)
Post Partum Day #2 Subjective: no complaints, up ad lib, voiding and tolerating PO  Objective: Blood pressure 117/64, pulse 86, temperature 97.8 F (36.6 C), temperature source Oral, resp. rate 18, height 5\' 2"  (1.575 m), weight 180 lb (81.6 kg), last menstrual period 08/05/2015, SpO2 98 %, unknown if currently breastfeeding.  Physical Exam:  General: alert, cooperative and no distress Lochia: appropriate Uterine Fundus: firm Incision: no significant drainage, no dehiscence, no significant erythema, 1st deg labial DVT Evaluation: No evidence of DVT seen on physical exam. No cords or calf tenderness. No significant calf/ankle edema.   Recent Labs  03/01/16 0952 03/02/16 0635  HGB 12.0 10.2*  HCT 35.1* 30.3*    Assessment/Plan: Discharge home, Social Work consult and Contraception undecided.  Dr. Lanier EnsignSaye manages her Subutex, has appointment with him next month.     LOS: 2 days   Roe CoombsRachelle A Elgar Scoggins, CNM 03/03/2016, 7:56 AM

## 2016-03-04 ENCOUNTER — Ambulatory Visit: Payer: Self-pay

## 2016-03-04 NOTE — Lactation Note (Signed)
This note was copied from a baby's chart. Lactation Consultation Note  Patient Name: Boy Marsa Arisshley Negrette WUJWJ'XToday's Date: 03/04/2016  [facility] Mom c/o of breast fullness and requesting ice packs. LC went to see Mom but she was not in her room. Advised family member to have Mom call her RN when she returns. Advised RN Maudie FlakesKris Wilson to evaluate Mom for engorgement. If breasts hard with milk set up pump or give hand pump for Mom to pump as needed for comfort, apply ice packs and will get cabbage leaves from cafeteria to help dry milk. Mom formula feeding only.    Maternal Data    Feeding Feeding Type: Bottle Fed - Formula Nipple Type: Slow - flow  LATCH Score/Interventions                      Lactation Tools Discussed/Used     Consult Status      Alfred LevinsGranger, Leea Rambeau Ann 03/04/2016, 2:36 PM

## 2016-03-04 NOTE — Clinical Social Work Maternal (Signed)
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)   Referral Source:  [facility]   Address:  [address] [place], [place] [address]  Phone number:  [phone]   Household Members:  Spouse, Minor Children (Couple has three daughters at home: Destiny/10, Nurse, children's, and Kiley/3.)   Natural Supports (not living in the home):  Friends, Immediate Family, Extended Family (MOB reports that she has a good support system, however, she does not fully trust anyone to watch her kids.)   Professional Supports: Organized support group (Comment) (MOB attends group 1x per month at [facility] in [place] where she receives her Suboxone.)   Employment: Full-time   Type of Work:  (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  MOB stays at home.)   Education:      Financial Resources:  Medicaid   Other Resources:      Cultural/Religious Considerations Which May Impact Care: None stated.  MOB's facesheet notes religion as Non-Denominational.  Strengths:  Ability to meet basic needs , Pediatrician chosen , Home prepared for child , Understanding of illness, Compliance with medical plan  ([facility] in [place])   Risk Factors/Current Problems:  Substance Use , Transportation  (MOB reports that she does not currently have a license due to owing money for a speeding ticket.)   Cognitive State:  Alert , Able to Concentrate , Insightful , Linear Thinking , Goal Oriented    Mood/Affect:  Calm ,  Interested , Tearful , Relaxed    CSW Assessment: CSW met with MOB in her first floor room/132 to introduce services, offer support, and complete assessment due to 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 MOB's friend was present and MOB gave permission to speak about anything in front of her visitor.  MOB was pleasant, easy to engage and appeared receptive to CSW's visit. MOB reports that baby is doing well.  She understands the reason to remain in the hospital for 5 days, but wishes this wasn't the case.  She spoke at length about her 3 daughters at home and how tearful she has been about being away from them and having to ask family to care for them.  She states they are in safe places, but that no one cares for them the same way she does.  Her 57 year old daughter called with assistance of MOB's sister who is currently caring for her while CSW was in the room.  MOB became tearful when talking with her daughter.  CSW validated and normalized MOB's feelings and encouraged her to identify positives and focus on them.  She was easily able to do so.  CSW encouraged her to allow herself to be emotional while trying to view this time as bonding time between just her and her son before going home and caring for all four children.  MOB agreed.  CSW provided education regarding PMADs.  MOB was attentive and engaged.  She reports that she has been more emotional throughout  this pregnancy and past two days than she was in any of her other pregnancies and days following delivery.  She acknowledges that change in situation given that this is her first child since starting Suboxone treatment.  She states she feels badly because she wasn't as excited about this pregnancy as she was her others, but reports only love and acceptance of the baby.  She states she did not find out she was pregnant until 6.5 months and struggled to get in for [facility].  She states the pregnancy was a surprise.  CSW validated her feelings related to  this as well and encouraged her to monitor for lack of bonding moving forward as well as other signs and symptoms of PMADs as previously discussed.  She denied PMADs following her other deliveries and stated understanding that it can occur after any birth.  CSW provided support group resources as well as New Mom Checklist as a self evaluation tool.  CSW asked that MOB report any concerns to her medical provider.  MOB agreed. MOB openly discussed her hx of substance use leading to Suboxone treatment.  She reports being sent home with narcotics after her last delivery and becoming addicted.  She reports that she has never used heroin or had issues with any substances other than "pain pills."  She states she was initially on Subutex and then stopped, relapsed and immediately went back into treatment.  She is currently on Suboxone and gets her medication and group counseling from [facility] in [place].  She reports feeling she has everything she needs as far as support for her hx of addiction.  She in understanding of signs to watch for regarding withdrawal in newborn and non-pharmacological interventions she can do.  She appears calm and bonded to baby.  He was asleep during entire assessment.  She was understanding of hospital drug screen policy discussed by CSW.  She knows baby's UDS is negative.  CSW informed MOB that CSW notes a positive UDS for Opiates twice in pregnancy, but also notes a prescription for narcotics due to a tooth abscess.  MOB confirmed this, states she will have follow up with her dentist now that baby has been born, and denies any other substance use.   CSW identifies no barriers to discharge when MOB and baby are medically ready.  CSW to monitor CDS result and make CPS report accordingly.  CSW Plan/Description:  Child Protective Service Report , Information/Referral to [employer] , No Further Intervention Required/No Barriers to Discharge, Patient/Family Education     Alphonzo Cruise, [place] 03/03/2016, 11:00 PM

## 2016-04-14 ENCOUNTER — Ambulatory Visit: Payer: Self-pay | Admitting: Advanced Practice Midwife
